# Patient Record
Sex: Female | Born: 2000 | Race: White | Hispanic: No | Marital: Single | Smoking: Never smoker
Health system: Southern US, Community
[De-identification: ages and names within clinical notes are randomized; demographics above are authoritative.]

## PROBLEM LIST (undated history)

## (undated) DIAGNOSIS — R000 Tachycardia, unspecified: Secondary | ICD-10-CM

## (undated) DIAGNOSIS — K219 Gastro-esophageal reflux disease without esophagitis: Secondary | ICD-10-CM

## (undated) DIAGNOSIS — F419 Anxiety disorder, unspecified: Secondary | ICD-10-CM

## (undated) DIAGNOSIS — E282 Polycystic ovarian syndrome: Secondary | ICD-10-CM

## (undated) DIAGNOSIS — F329 Major depressive disorder, single episode, unspecified: Secondary | ICD-10-CM

## (undated) DIAGNOSIS — F4310 Post-traumatic stress disorder, unspecified: Secondary | ICD-10-CM

## (undated) DIAGNOSIS — K760 Fatty (change of) liver, not elsewhere classified: Secondary | ICD-10-CM

## (undated) DIAGNOSIS — F32A Depression, unspecified: Secondary | ICD-10-CM

## (undated) HISTORY — PX: ANKLE SURGERY: SHX546

## (undated) HISTORY — DX: Polycystic ovarian syndrome: E28.2

## (undated) HISTORY — DX: Gastro-esophageal reflux disease without esophagitis: K21.9

## (undated) HISTORY — DX: Fatty (change of) liver, not elsewhere classified: K76.0

## (undated) HISTORY — PX: CYST EXCISION: SHX5701

## (undated) HISTORY — PX: MOUTH SURGERY: SHX715

## (undated) HISTORY — DX: Tachycardia, unspecified: R00.0

---
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
## 2022-12-17 ENCOUNTER — Encounter (HOSPITAL_COMMUNITY): Payer: Self-pay | Admitting: Emergency Medicine

## 2022-12-17 ENCOUNTER — Other Ambulatory Visit: Payer: Self-pay

## 2022-12-17 ENCOUNTER — Emergency Department (HOSPITAL_COMMUNITY)
Admission: EM | Admit: 2022-12-17 | Discharge: 2022-12-17 | Disposition: A | Discharge status: Home / Self Care | Payer: Medicaid Other | Attending: Emergency Medicine | Admitting: Emergency Medicine

## 2022-12-17 DIAGNOSIS — G8918 Other acute postprocedural pain: Secondary | ICD-10-CM | POA: Diagnosis present

## 2022-12-17 MED ORDER — DOXYCYCLINE HYCLATE 100 MG PO CAPS: 100.0000 mg | ORAL_CAPSULE | Freq: Two times a day (BID) | ORAL | 0 refills | Status: DC

## 2022-12-17 NOTE — Discharge Instructions (Signed)
Keep wound clean and dry. Continue to bandage the wound once a day.  You can start antibiotic to help with any possible infection

## 2022-12-17 NOTE — ED Provider Notes (Signed)
[place] EMERGENCY DEPARTMENT AT [facility] Provider Note   CSN: [id number] Arrival date & time: 12/17/22  0032     History  Chief Complaint  Patient presents with   Post-op Problem    Kaitlyn Ferrell is a 22 y.o. female.  The history is provided by the patient and a significant other.  Patient with history of depression presents for postoperative wound check. Patient reports that she has had a complicated history of pilonidal abscesses.  Around June 3 she had cleft lift procedure performed at an outside hospital the surgery clinic.  She has had multiple outpatient visits since that time and is concerned that it is draining fluid.  She is concerned that it is infected.  She has a wound follow-up next month.  No fevers.  She has had normal bowel movements.  She is now having some abdominal cramping but no other acute issues    Past Medical History:  Diagnosis Date   Depression     Home Medications Prior to Admission medications   Medication Sig Start Date End Date Taking? Authorizing Provider  doxycycline (VIBRAMYCIN) 100 MG capsule Take 1 capsule (100 mg total) by mouth 2 (two) times daily. 12/17/22  Yes Zadie Rhine, MD  lidocaine (XYLOCAINE) 2 % solution Gargle 15 mL every 3 hours as needed. May swallow if desired. 03/08/18   Tommie Sams, DO  sertraline (ZOLOFT) 50 MG tablet TAKE 1.5 ORAL TABLET ONCE A DAY X 2 WEEKS THEN 2 TABLETS DAILY 09/08/18   [provider]      Allergies    Bee venom and Latex    Review of Systems   Review of Systems  Constitutional:  Negative for fever.  Skin:  Positive for wound.    Physical Exam Updated Vital Signs BP 121/78 (BP Location: Left Arm)   Pulse 90   Temp 98.5 F (36.9 C) (Oral)   Resp 18   Ht 1.715 m (5' 7.5")   Wt 93.4 kg   SpO2 97%   BMI 31.79 kg/m  Physical Exam CONSTITUTIONAL: Well developed/well nourished HEAD: Normocephalic/atraumatic ABDOMEN: soft, nontender NEURO: Pt is  awake/alert/appropriate, moves all extremitiesx4.  No facial droop.   SKIN: warm, color normal Wound examined with nurse present Overall the wound is very well-healed. The superior portion is well aligned without any erythema, no crepitus, no fluctuance.  The inferior portion is open with minimal erythema.  No discharge.  No foreign bodies are noted. PSYCH: no abnormalities of mood noted, alert and oriented to situation  ED Results / Procedures / Treatments   Labs (all labs ordered are listed, but only abnormal results are displayed) Labs Reviewed - No data to display  EKG None  Radiology No results found.  Procedures Procedures    Medications Ordered in ED Medications - No data to display  ED Course/ Medical Decision Making/ A&P                             Medical Decision Making Risk Prescription drug management.   Overall this wound appears to be healing well.  It appears consistent with descriptions that are noted in the record from previous outpatient visits.  Advised to keep clean, continue wound care and supplies were given to the patient.  Due to some mild erythema, patient would prefer to start an oral antibiotic, she has tolerated doxycycline previously Otherwise patient is safe for outpatient management, no indication for further  workup or imaging at this time        Final Clinical Impression(s) / ED Diagnoses Final diagnoses:  Post-operative pain    Rx / DC Orders ED Discharge Orders          Ordered    doxycycline (VIBRAMYCIN) 100 MG capsule  2 times daily        12/17/22 0148              Zadie Rhine, MD 12/17/22 [phone]

## 2022-12-17 NOTE — ED Triage Notes (Signed)
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 Pt is concerned for infection. States she has brought her concerns up to her surgeon but "feels like they aren't listening".

## 2022-12-23 ENCOUNTER — Emergency Department (HOSPITAL_COMMUNITY)
Admission: EM | Admit: 2022-12-23 | Discharge: 2022-12-23 | Disposition: A | Discharge status: Home / Self Care | Payer: Medicaid Other | Attending: Emergency Medicine | Admitting: Emergency Medicine

## 2022-12-23 ENCOUNTER — Other Ambulatory Visit: Payer: Self-pay

## 2022-12-23 ENCOUNTER — Encounter (HOSPITAL_COMMUNITY): Payer: Self-pay

## 2022-12-23 DIAGNOSIS — Z91040 Latex allergy status: Secondary | ICD-10-CM | POA: Insufficient documentation

## 2022-12-23 DIAGNOSIS — K219 Gastro-esophageal reflux disease without esophagitis: Secondary | ICD-10-CM | POA: Diagnosis not present

## 2022-12-23 DIAGNOSIS — R1013 Epigastric pain: Secondary | ICD-10-CM | POA: Diagnosis present

## 2022-12-23 HISTORY — DX: Anxiety disorder, unspecified: F41.9

## 2022-12-23 HISTORY — DX: Post-traumatic stress disorder, unspecified: F43.10

## 2022-12-23 LAB — COMPREHENSIVE METABOLIC PANEL
ALT: 40 U/L (ref 0–44)
AST: 26 U/L (ref 15–41)
Albumin: 4.2 g/dL (ref 3.5–5.0)
Alkaline Phosphatase: 58 U/L (ref 38–126)
Anion gap: 9 (ref 5–15)
BUN: 8 mg/dL (ref 6–20)
CO2: 24 mmol/L (ref 22–32)
Calcium: 9.6 mg/dL (ref 8.9–10.3)
Chloride: 103 mmol/L (ref 98–111)
Creatinine, Ser: 0.63 mg/dL (ref 0.44–1.00)
GFR, Estimated: >60 mL/min (ref >60)
Glucose, Bld: 124 mg/dL — ABNORMAL HIGH (ref 70–99)
Potassium: 3.5 mmol/L (ref 3.5–5.1)
Sodium: 136 mmol/L (ref 135–145)
Total Bilirubin: 0.8 mg/dL (ref 0.3–1.2)
Total Protein: 7.6 g/dL (ref 6.5–8.1)

## 2022-12-23 LAB — URINALYSIS, ROUTINE W REFLEX MICROSCOPIC
Bilirubin Urine: NEGATIVE
Glucose, UA: NEGATIVE mg/dL
Hgb urine dipstick: NEGATIVE
Ketones, ur: 5 mg/dL — AB
Leukocytes,Ua: NEGATIVE
Nitrite: NEGATIVE
Protein, ur: NEGATIVE mg/dL
Specific Gravity, Urine: 1.02 (ref 1.005–1.030)
pH: 5 (ref 5.0–8.0)

## 2022-12-23 LAB — CBC
HCT: 43.5 % (ref 36.0–46.0)
Hemoglobin: 14.9 g/dL (ref 12.0–15.0)
MCH: 29.4 pg (ref 26.0–34.0)
MCHC: 34.3 g/dL (ref 30.0–36.0)
MCV: 86 fL (ref 80.0–100.0)
Platelets: 372 10*3/uL (ref 150–400)
RBC: 5.06 MIL/uL (ref 3.87–5.11)
RDW: 12.1 % (ref 11.5–15.5)
WBC: 8.4 10*3/uL (ref 4.0–10.5)
nRBC: 0 % (ref 0.0–0.2)

## 2022-12-23 LAB — LIPASE, BLOOD: Lipase: 31 U/L (ref 11–51)

## 2022-12-23 LAB — PREGNANCY, URINE: Preg Test, Ur: NEGATIVE

## 2022-12-23 MED ORDER — FAMOTIDINE IN NACL 20-0.9 MG/50ML-% IV SOLN
20.0000 mg | Freq: Once | INTRAVENOUS | Status: AC
  Administered 2022-12-23: 20 mg via INTRAVENOUS
  Filled 2022-12-23: qty 50

## 2022-12-23 MED ORDER — ONDANSETRON HCL 4 MG PO TABS: 4.0000 mg | ORAL_TABLET | Freq: Four times a day (QID) | ORAL | 0 refills | Status: DC

## 2022-12-23 MED ORDER — PANTOPRAZOLE SODIUM 20 MG PO TBEC: 20.0000 mg | DELAYED_RELEASE_TABLET | Freq: Every day | ORAL | 0 refills | Status: DC

## 2022-12-23 MED ORDER — SODIUM CHLORIDE 0.9 % IV BOLUS
1000.0000 mL | Freq: Once | INTRAVENOUS | Status: AC
  Administered 2022-12-23: 1000 mL via INTRAVENOUS

## 2022-12-23 MED ORDER — ONDANSETRON HCL 4 MG/2ML IJ SOLN
4.0000 mg | Freq: Once | INTRAMUSCULAR | Status: AC | PRN
  Administered 2022-12-23: 4 mg via INTRAVENOUS
  Filled 2022-12-23: qty 2

## 2022-12-23 NOTE — Discharge Instructions (Signed)
Please follow-up with the GI specialist I have attached your for you today regarding recent symptoms and ER visit.  Today your labs and physical exam are reassuring and you most likely have acid reflux causing your symptoms.  Please take the medications I have attached here for you and follow-up with the GI specialist.  In the meantime please avoid spicy foods, hot drinks, carbonated drinks, chocolate, fatty/greasy foods, gluten foods as these may exacerbate your symptoms.  If symptoms change or worsen please return to ER.

## 2022-12-23 NOTE — ED Triage Notes (Signed)
Pt to ER states abdominal pain for last 3-4 days.  She thought it was related to Abx she was taking so she stopped them with not relief.  States pain radiates into her hip.  States nausea and vomiting.

## 2022-12-23 NOTE — ED Notes (Addendum)
Urine sample at bedside

## 2022-12-23 NOTE — ED Provider Notes (Signed)
[place] EMERGENCY DEPARTMENT AT [facility] Provider Note   CSN: [id number] Arrival date & time: 12/23/22  1535     History  Chief Complaint  Patient presents with   Abdominal Pain    Kaitlyn Ferrell is a 22 y.o. female history of acid reflux, pilonidal abscess, PCOS, hepatic steatosis presented with epigastric pain that has been present for 3 days.  Patient states that she was seen last week for pilonidal abscess checked and was given doxycycline.  Patient states that the abscess has healed nicely however since taking the doxycycline patient has been having epigastric tightness that leads to nausea and vomiting.  This is usually triggered after eating bread or greasy foods.  Patient does note that she has history of acid reflux but does not take any prophylactic medication.  Patient notes that when she is puking she begins to feel lightheaded but denies any hematemesis.  Patient denies any fevers, diarrhea/constipation, chest pain, shortness of breath, head pain, vision changes, neck pain, LOC, headache  Home Medications Prior to Admission medications   Medication Sig Start Date End Date Taking? Authorizing Provider  ondansetron (ZOFRAN) 4 MG tablet Take 1 tablet (4 mg total) by mouth every 6 (six) hours. 12/23/22  Yes Misbah Hornaday, Beverly Gust, PA-C  pantoprazole (PROTONIX) 20 MG tablet Take 1 tablet (20 mg total) by mouth daily. 12/23/22 01/22/23 Yes Chisa Kushner, Beverly Gust, PA-C  doxycycline (VIBRAMYCIN) 100 MG capsule Take 1 capsule (100 mg total) by mouth 2 (two) times daily. 12/17/22   Zadie Rhine, MD  lidocaine (XYLOCAINE) 2 % solution Gargle 15 mL every 3 hours as needed. May swallow if desired. 03/08/18   Tommie Sams, DO  sertraline (ZOLOFT) 50 MG tablet TAKE 1.5 ORAL TABLET ONCE A DAY X 2 WEEKS THEN 2 TABLETS DAILY 09/08/18   [provider]      Allergies    Amoxicillin, Bee venom, and Latex    Review of Systems   Review of Systems  Gastrointestinal:  Positive for  abdominal pain.    Physical Exam Updated Vital Signs BP 118/70 (BP Location: Right Arm)   Pulse 83   Temp 98.9 F (37.2 C) (Oral)   Resp 18   Ht 5\' 7"  (1.702 m)   Wt 93.4 kg   LMP 03/25/2022   SpO2 96%   BMI 32.26 kg/m  Physical Exam Vitals reviewed.  Constitutional:      General: She is not in acute distress. HENT:     Head: Normocephalic and atraumatic.  Eyes:     Extraocular Movements: Extraocular movements intact.     Conjunctiva/sclera: Conjunctivae normal.     Pupils: Pupils are equal, round, and reactive to light.  Cardiovascular:     Rate and Rhythm: Normal rate and regular rhythm.     Pulses: Normal pulses.     Heart sounds: Normal heart sounds.     Comments: 2+ bilateral radial/dorsalis pedis pulses with regular rate Pulmonary:     Effort: Pulmonary effort is normal. No respiratory distress.     Breath sounds: Normal breath sounds.  Abdominal:     Palpations: Abdomen is soft.     Tenderness: There is no abdominal tenderness. There is no guarding or rebound.  Musculoskeletal:        General: Normal range of motion.     Cervical back: Normal range of motion and neck supple.     Comments: 5 out of 5 bilateral grip/leg extension strength  Skin:    General:  Skin is warm and dry.     Capillary Refill: Capillary refill takes less than 2 seconds.  Neurological:     General: No focal deficit present.     Mental Status: She is alert and oriented to person, place, and time.     Comments: Sensation intact in all 4 limbs  Psychiatric:        Mood and Affect: Mood normal.     ED Results / Procedures / Treatments   Labs (all labs ordered are listed, but only abnormal results are displayed) Labs Reviewed  COMPREHENSIVE METABOLIC PANEL - Abnormal; Notable for the following components:      Result Value   Glucose, Bld 124 (*)    All other components within normal limits  URINALYSIS, ROUTINE W REFLEX MICROSCOPIC - Abnormal; Notable for the following components:    Ketones, ur 5 (*)    All other components within normal limits  LIPASE, BLOOD  CBC  PREGNANCY, URINE  POC URINE PREG, ED    EKG None  Radiology No results found.  Procedures Procedures    Medications Ordered in ED Medications  famotidine (PEPCID) IVPB 20 mg premix (20 mg Intravenous New Bag/Given 12/23/22 1726)  ondansetron (ZOFRAN) injection 4 mg (4 mg Intravenous Given 12/23/22 1632)  sodium chloride 0.9 % bolus 1,000 mL (1,000 mLs Intravenous New Bag/Given 12/23/22 1722)    ED Course/ Medical Decision Making/ A&P                             Medical Decision Making Amount and/or Complexity of Data Reviewed Labs: ordered.  Risk Prescription drug management.   Kaitlyn Ferrell 21 y.o. presented today for epigastric abdominal pain. Working DDx that I considered at this time includes, but not limited to, GERD, gluten intolerance, celiac, gastroenteritis, colitis, small bowel obstruction, appendicitis, cholecystitis, pancreatitis, nephrolithiasis, AAA, UTI, pyelonephritis, ruptured ectopic pregnancy, PID, ovarian torsion.  R/o DDx: gluten intolerance, celiac, gastroenteritis, colitis, small bowel obstruction, appendicitis, cholecystitis, pancreatitis, nephrolithiasis, AAA, UTI, pyelonephritis, ruptured ectopic pregnancy, PID, ovarian torsion: These are considered less likely due to history of present illness and physical exam findings.  Review of prior external notes: 12/18/2022 ED  Unique Tests and My Interpretation:  CBC with differential: Unremarkable CMP: Unremarkable Lipase: Unremarkable UA: Unremarkable Urine Pregnancy: Negative EKG: Rate, rhythm, axis, intervals all examined and without medically relevant abnormality. ST segments without concerns for elevations  Discussion with Independent Historian:  Significant other  Discussion of Management of Tests: None  Risk: Medium: prescription drug management  Risk Stratification Score: None  Plan: On exam  patient was in no acute distress and had stable vitals.  Patient was tender in the epigastric region but no peritoneal signs were noted.  The rest patient's physical exam was reassuring.  Due to patient's history of high suspect patient may have gluten intolerance as her symptoms seem to be exacerbated after eating bread.  Patient may also be having exacerbation of her acid reflux since she states after eating greasy foods she has stomach cramping with nausea vomiting.  Abdominal labs were ordered and patient was given Zofran for her nausea which appeared to help.  Patient was given fluids and Pepcid and anticipated discharge with PPI and GI follow-up.  Patient stable at this time.  On recheck patient stated that she felt much better after receiving the fluids and Pepcid.  Patient had reassuring labs.  At this time suspect patient's symptoms are acid reflux related  with possible gluten intolerance as patient states eating bread exacerbates his symptoms as well.  I recommended patient avoid foods that may trigger acid reflux or gluten intolerance and to follow-up with a GI specialist for further management.  Patient will be prescribed Zofran and Protonix to take in the meantime.  At this time with normal labs and reassuring physical exam a CT scan is not needed.  Patient was given return precautions. Patient stable for discharge at this time.  Patient verbalized understanding of plan.         Final Clinical Impression(s) / ED Diagnoses Final diagnoses:  Gastroesophageal reflux disease without esophagitis    Rx / DC Orders ED Discharge Orders          Ordered    ondansetron (ZOFRAN) 4 MG tablet  Every 6 hours        12/23/22 1736    pantoprazole (PROTONIX) 20 MG tablet  Daily        12/23/22 1736              Remi Deter 12/23/22 1739    Terrilee Files, MD 12/24/22 1110

## 2022-12-24 ENCOUNTER — Encounter (HOSPITAL_COMMUNITY): Payer: Self-pay | Admitting: Emergency Medicine

## 2022-12-24 ENCOUNTER — Other Ambulatory Visit: Payer: Self-pay

## 2022-12-24 ENCOUNTER — Emergency Department (HOSPITAL_COMMUNITY)
Admission: EM | Admit: 2022-12-24 | Discharge: 2022-12-25 | Discharge status: Against Medical Advice | Payer: Medicaid Other | Attending: Emergency Medicine | Admitting: Emergency Medicine

## 2022-12-24 DIAGNOSIS — R1010 Upper abdominal pain, unspecified: Secondary | ICD-10-CM | POA: Insufficient documentation

## 2022-12-24 DIAGNOSIS — R1110 Vomiting, unspecified: Secondary | ICD-10-CM | POA: Insufficient documentation

## 2022-12-24 DIAGNOSIS — Z5321 Procedure and treatment not carried out due to patient leaving prior to being seen by health care provider: Secondary | ICD-10-CM | POA: Insufficient documentation

## 2022-12-24 LAB — COMPREHENSIVE METABOLIC PANEL
ALT: 36 U/L (ref 0–44)
AST: 23 U/L (ref 15–41)
Albumin: 4.1 g/dL (ref 3.5–5.0)
Alkaline Phosphatase: 60 U/L (ref 38–126)
Anion gap: 7 (ref 5–15)
BUN: 10 mg/dL (ref 6–20)
CO2: 26 mmol/L (ref 22–32)
Calcium: 9.5 mg/dL (ref 8.9–10.3)
Chloride: 104 mmol/L (ref 98–111)
Creatinine, Ser: 0.84 mg/dL (ref 0.44–1.00)
GFR, Estimated: >60 mL/min (ref >60)
Glucose, Bld: 106 mg/dL — ABNORMAL HIGH (ref 70–99)
Potassium: 3.8 mmol/L (ref 3.5–5.1)
Sodium: 137 mmol/L (ref 135–145)
Total Bilirubin: 0.5 mg/dL (ref 0.3–1.2)
Total Protein: 7.5 g/dL (ref 6.5–8.1)

## 2022-12-24 LAB — CBC
HCT: 42.9 % (ref 36.0–46.0)
Hemoglobin: 14.7 g/dL (ref 12.0–15.0)
MCH: 29.6 pg (ref 26.0–34.0)
MCHC: 34.3 g/dL (ref 30.0–36.0)
MCV: 86.3 fL (ref 80.0–100.0)
Platelets: 372 10*3/uL (ref 150–400)
RBC: 4.97 MIL/uL (ref 3.87–5.11)
RDW: 12.1 % (ref 11.5–15.5)
WBC: 9.7 10*3/uL (ref 4.0–10.5)
nRBC: 0 % (ref 0.0–0.2)

## 2022-12-24 LAB — LIPASE, BLOOD: Lipase: 36 U/L (ref 11–51)

## 2022-12-24 MED ORDER — ONDANSETRON HCL 4 MG/2ML IJ SOLN: 4.0000 mg | Freq: Once | INTRAMUSCULAR | Status: DC | PRN

## 2022-12-24 NOTE — ED Triage Notes (Signed)
Pt via POV c/o continued emesis and upper abdominal pain x 4-5 days, seen yesterday for same. She was told yesterday that she may have a gluten intolerance or gluten allergy and that she should return if symptoms persist or worsen. Estimates 5 episodes of vomiting today despite eating gluten free foods all day.

## 2022-12-25 NOTE — ED Notes (Signed)
Pt called the [facility] number to discuss her wait time and "attitude of staff in the ed"  pt started the phone call with cursing, [facility] asked the pt for her name and explained that I was sorry for her wait but that cursing at staff was not tolerated and pt hung up the phone.

## 2022-12-26 ENCOUNTER — Ambulatory Visit (INDEPENDENT_AMBULATORY_CARE_PROVIDER_SITE_OTHER): Payer: Medicaid Other | Admitting: Gastroenterology

## 2022-12-26 ENCOUNTER — Encounter: Payer: Self-pay | Admitting: Gastroenterology

## 2022-12-26 VITALS — BP 109/70 | HR 97 | Temp 98.8°F | Ht 67.0 in | Wt 206.0 lb

## 2022-12-26 DIAGNOSIS — R112 Nausea with vomiting, unspecified: Secondary | ICD-10-CM

## 2022-12-26 DIAGNOSIS — R109 Unspecified abdominal pain: Secondary | ICD-10-CM

## 2022-12-26 DIAGNOSIS — R140 Abdominal distension (gaseous): Secondary | ICD-10-CM | POA: Insufficient documentation
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
## 2022-12-26 NOTE — Patient Instructions (Signed)
Please complete blood work. We will be in touch by MyChart!  I have attached a handout for gluten-free diet. Avoid gluten completely so you can avoid symptoms.  Continue pantoprazole once a day for now, 30 minutes before breakfast!  Further recommendations to follow!   It was a pleasure to see you today. I want to create trusting relationships with patients and provide genuine, compassionate, and quality care. I truly value your feedback, so please be on the lookout for a survey regarding your visit with me today. I appreciate your time in completing this!         [name] Mink, PhD, ANP-BC [facility] Gastroenterology
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
## 2022-12-27 LAB — IGA: IgA/Immunoglobulin A, Serum: 226 mg/dL (ref 87–352)

## 2022-12-29 LAB — TISSUE TRANSGLUTAMINASE, IGA: Transglutaminase IgA: <2 U/mL (ref 0–3)

## 2023-01-01 ENCOUNTER — Telehealth: Payer: Self-pay

## 2023-01-01 DIAGNOSIS — R109 Unspecified abdominal pain: Secondary | ICD-10-CM

## 2023-01-01 DIAGNOSIS — R112 Nausea with vomiting, unspecified: Secondary | ICD-10-CM

## 2023-01-01 NOTE — Telephone Encounter (Signed)
noted 

## 2023-01-01 NOTE — Telephone Encounter (Signed)
Sent in mychart

## 2023-01-01 NOTE — Telephone Encounter (Signed)
I was sent a vm from up the street regarding this pt. She was seen at [facility] on July 3rd. Pt has seen her blood work but she is wanting you to follow up with blood work with her. Please advise

## 2023-01-02 ENCOUNTER — Other Ambulatory Visit: Payer: Self-pay | Admitting: *Deleted

## 2023-01-02 DIAGNOSIS — R109 Unspecified abdominal pain: Secondary | ICD-10-CM

## 2023-01-02 DIAGNOSIS — R140 Abdominal distension (gaseous): Secondary | ICD-10-CM

## 2023-01-02 NOTE — Telephone Encounter (Signed)
Please arrange RUQ [name] ASAP due to abdominal pain,bloating, concern for cholelithiasis/cholecystitis.   Patient communicates on MyChart.

## 2023-01-03 ENCOUNTER — Ambulatory Visit (HOSPITAL_COMMUNITY)
Admission: RE | Admit: 2023-01-03 | Discharge: 2023-01-03 | Disposition: A | Discharge status: Home / Self Care | Payer: Medicaid Other | Source: Ambulatory Visit | Attending: Gastroenterology | Admitting: Gastroenterology

## 2023-01-03 DIAGNOSIS — R109 Unspecified abdominal pain: Secondary | ICD-10-CM | POA: Diagnosis present

## 2023-01-03 DIAGNOSIS — R140 Abdominal distension (gaseous): Secondary | ICD-10-CM | POA: Insufficient documentation

## 2023-01-04 MED ORDER — PANTOPRAZOLE SODIUM 40 MG PO TBEC: 40.0000 mg | DELAYED_RELEASE_TABLET | Freq: Every day | ORAL | 3 refills | Status: DC

## 2023-01-04 NOTE — Telephone Encounter (Signed)
Spoke with pt. She has been scheduled Tuesday 7/16 with Dr. Marletta Lor. She is aware needs to go 1st thing Monday AM to have urine preg test at [facility]. Discussed instructions and also sent to [facility].

## 2023-01-04 NOTE — Telephone Encounter (Signed)
Please arrange EGD with Dr. Marletta Lor. Diagnosis: N/V, epigastric pain. Needs pregnancy screen prior.   ASA 2.  I have increased pantoprazole to 40 mg instead of 20 mg and let patient know in MyChart.

## 2023-01-04 NOTE — Telephone Encounter (Signed)
Called pt back, LMOVM to call back

## 2023-01-04 NOTE — Addendum Note (Signed)
Addended by: [name] Mink on: 01/04/2023 10:47 AM   Modules accepted: Orders

## 2023-01-04 NOTE — Addendum Note (Signed)
Addended by: Armstead Peaks on: 01/04/2023 11:33 AM   Modules accepted: Orders

## 2023-01-08 ENCOUNTER — Other Ambulatory Visit: Payer: Self-pay

## 2023-01-08 ENCOUNTER — Encounter (HOSPITAL_COMMUNITY)
Admission: RE | Disposition: A | Discharge status: Home / Self Care | Payer: Self-pay | Source: Home / Self Care | Attending: Internal Medicine

## 2023-01-08 ENCOUNTER — Ambulatory Visit (HOSPITAL_COMMUNITY)
Admission: RE | Admit: 2023-01-08 | Discharge: 2023-01-08 | Disposition: A | Discharge status: Home / Self Care | Payer: Medicaid Other | Attending: Internal Medicine | Admitting: Internal Medicine

## 2023-01-08 ENCOUNTER — Ambulatory Visit (HOSPITAL_BASED_OUTPATIENT_CLINIC_OR_DEPARTMENT_OTHER): Payer: Medicaid Other | Admitting: Anesthesiology

## 2023-01-08 ENCOUNTER — Encounter (HOSPITAL_COMMUNITY): Payer: Self-pay

## 2023-01-08 ENCOUNTER — Ambulatory Visit (HOSPITAL_COMMUNITY): Payer: Self-pay | Admitting: Anesthesiology

## 2023-01-08 DIAGNOSIS — R109 Unspecified abdominal pain: Secondary | ICD-10-CM

## 2023-01-08 DIAGNOSIS — K219 Gastro-esophageal reflux disease without esophagitis: Secondary | ICD-10-CM | POA: Insufficient documentation

## 2023-01-08 DIAGNOSIS — K2970 Gastritis, unspecified, without bleeding: Secondary | ICD-10-CM

## 2023-01-08 DIAGNOSIS — R112 Nausea with vomiting, unspecified: Secondary | ICD-10-CM

## 2023-01-08 DIAGNOSIS — K760 Fatty (change of) liver, not elsewhere classified: Secondary | ICD-10-CM | POA: Diagnosis not present

## 2023-01-08 HISTORY — PX: ESOPHAGOGASTRODUODENOSCOPY (EGD) WITH PROPOFOL: SHX5813

## 2023-01-08 HISTORY — PX: BIOPSY: SHX5522

## 2023-01-08 LAB — POCT PREGNANCY, URINE: Preg Test, Ur: NEGATIVE

## 2023-01-08 SURGERY — ESOPHAGOGASTRODUODENOSCOPY (EGD) WITH PROPOFOL
Anesthesia: General

## 2023-01-08 MED ORDER — LIDOCAINE HCL (CARDIAC) PF 100 MG/5ML IV SOSY
PREFILLED_SYRINGE | INTRAVENOUS | Status: DC | PRN
  Administered 2023-01-08: 80 mg via INTRAVENOUS

## 2023-01-08 MED ORDER — PROPOFOL 10 MG/ML IV BOLUS
INTRAVENOUS | Status: DC | PRN
Start: 2023-01-08 — End: 2023-01-08
  Administered 2023-01-08 (×2): 50 mg via INTRAVENOUS
  Administered 2023-01-08: 200 mg via INTRAVENOUS

## 2023-01-08 MED ORDER — LACTATED RINGERS IV SOLN: INTRAVENOUS | Status: DC

## 2023-01-08 NOTE — Interval H&P Note (Signed)
History and Physical Interval Note:  01/08/2023 8:07 AM  [employer]  has presented today for surgery, with the diagnosis of NAUSEA, VOMITING, epigastric pain.  The various methods of treatment have been discussed with the patient and family. After consideration of risks, benefits and other options for treatment, the patient has consented to  Procedure(s) with comments: ESOPHAGOGASTRODUODENOSCOPY (EGD) WITH PROPOFOL (N/A) - 800am, asa 2 as a surgical intervention.  The patient's history has been reviewed, patient examined, no change in status, stable for surgery.  I have reviewed the patient's chart and labs.  Questions were answered to the patient's satisfaction.     Lanelle Bal

## 2023-01-08 NOTE — Anesthesia Postprocedure Evaluation (Signed)
Anesthesia Post Note  Patient: Kaitlyn Ferrell  Procedure(s) Performed: ESOPHAGOGASTRODUODENOSCOPY (EGD) WITH PROPOFOL BIOPSY  Patient location during evaluation: PACU Anesthesia Type: General Level of consciousness: awake and alert and oriented Pain management: pain level controlled Vital Signs Assessment: post-procedure vital signs reviewed and stable Respiratory status: spontaneous breathing, nonlabored ventilation and respiratory function stable Cardiovascular status: blood pressure returned to baseline and stable Postop Assessment: no apparent nausea or vomiting Anesthetic complications: no  No notable events documented.   Last Vitals:  Vitals:   01/08/23 0714 01/08/23 0828  BP: 117/72 (!) 106/42  Pulse: 71 82  Resp: 18 (!) 22  Temp: 36.8 C 36.6 C  SpO2: 98% 96%    Last Pain:  Vitals:   01/08/23 0840  TempSrc:   PainSc: 0-No pain                 Alvin Rubano C Giuseppina Quinones

## 2023-01-08 NOTE — Discharge Instructions (Addendum)
EGD Discharge instructions Please read the instructions outlined below and refer to this sheet in the next few weeks. These discharge instructions provide you with general information on caring for yourself after you leave the hospital. Your doctor may also give you specific instructions. While your treatment has been planned according to the most current medical practices available, unavoidable complications occasionally occur. If you have any problems or questions after discharge, please call your doctor. ACTIVITY You may resume your regular activity but move at a slower pace for the next 24 hours.  Take frequent rest periods for the next 24 hours.  Walking will help expel (get rid of) the air and reduce the bloated feeling in your abdomen.  No driving for 24 hours (because of the anesthesia (medicine) used during the test).  You may shower.  Do not sign any important legal documents or operate any machinery for 24 hours (because of the anesthesia used during the test).  NUTRITION Drink plenty of fluids.  You may resume your normal diet.  Begin with a light meal and progress to your normal diet.  Avoid alcoholic beverages for 24 hours or as instructed by your caregiver.  MEDICATIONS You may resume your normal medications unless your caregiver tells you otherwise.  WHAT YOU CAN EXPECT TODAY You may experience abdominal discomfort such as a feeling of fullness or "gas" pains.  FOLLOW-UP Your doctor will discuss the results of your test with you.  SEEK IMMEDIATE MEDICAL ATTENTION IF ANY OF THE FOLLOWING OCCUR: Excessive nausea (feeling sick to your stomach) and/or vomiting.  Severe abdominal pain and distention (swelling).  Trouble swallowing.  Temperature over 101 F (37.8 C).  Rectal bleeding or vomiting of blood.   Your EGD revealed mild amount inflammation in your stomach.  I took biopsies of this to rule out infection with a bacteria called H. pylori.    Esophagus appeared normal.   Small bowel appeared normal.  I did take biopsies of both however given your symptoms.  Await pathology results, my office will contact you.  Continue on pantoprazole daily.  Follow-up in GI office in 2 to 3 months. Office will contact you about appointment.   I hope you have a great rest of your week!  [name]. Marletta Lor, D.O. Gastroenterology and Hepatology [facility]

## 2023-01-08 NOTE — Transfer of Care (Signed)
Immediate Anesthesia Transfer of Care Note  Patient: Kaitlyn Ferrell  Procedure(s) Performed: ESOPHAGOGASTRODUODENOSCOPY (EGD) WITH PROPOFOL BIOPSY  Patient Location: Endoscopy Unit  Anesthesia Type:General  Level of Consciousness: drowsy and patient cooperative  Airway & Oxygen Therapy: Patient Spontanous Breathing  Post-op Assessment: Report given to RN and Post -op Vital signs reviewed and stable  Post vital signs: Reviewed and stable  Last Vitals:  Vitals Value Taken Time  BP 106/42 01/08/23 0828  Temp 36.6 C 01/08/23 0828  Pulse 82 01/08/23 0828  Resp 22 01/08/23 0828  SpO2 96 % 01/08/23 0828    Last Pain:  Vitals:   01/08/23 0828  TempSrc: Oral  PainSc:       Patients Stated Pain Goal: 4 (01/08/23 0714)  Complications: No notable events documented.

## 2023-01-08 NOTE — Anesthesia Procedure Notes (Signed)
Date/Time: 01/08/2023 8:12 AM  Performed by: Franco Nones, CRNAPre-anesthesia Checklist: Patient identified, Emergency Drugs available, Suction available, Timeout performed and Patient being monitored Patient Re-evaluated:Patient Re-evaluated prior to induction Oxygen Delivery Method: Nasal Cannula

## 2023-01-08 NOTE — Anesthesia Preprocedure Evaluation (Addendum)
Anesthesia Evaluation  Patient identified by MRN, date of birth, ID band Patient awake    Reviewed: Allergy & Precautions, H&P , NPO status , Patient's Chart, lab work & pertinent test results  Airway Mallampati: II  TM Distance: >3 FB Neck ROM: Full    Dental  (+) Dental Advisory Given, Teeth Intact   Pulmonary neg pulmonary ROS, Patient abstained from smoking.   Pulmonary exam normal breath sounds clear to auscultation       Cardiovascular negative cardio ROS Normal cardiovascular exam Rhythm:Regular Rate:Normal     Neuro/Psych  PSYCHIATRIC DISORDERS Anxiety Depression    negative neurological ROS     GI/Hepatic Neg liver ROS,GERD  Medicated,,  Endo/Other  negative endocrine ROS    Renal/GU negative Renal ROS  negative genitourinary   Musculoskeletal negative musculoskeletal ROS (+)    Abdominal   Peds negative pediatric ROS (+)  Hematology negative hematology ROS (+)   Anesthesia Other Findings   Reproductive/Obstetrics negative OB ROS                             Anesthesia Physical Anesthesia Plan  ASA: 2  Anesthesia Plan: General   Post-op Pain Management: Minimal or no pain anticipated   Induction: Intravenous  PONV Risk Score and Plan: 1 and Propofol infusion  Airway Management Planned: Nasal Cannula and Natural Airway  Additional Equipment:   Intra-op Plan:   Post-operative Plan:   Informed Consent: I have reviewed the patients History and Physical, chart, labs and discussed the procedure including the risks, benefits and alternatives for the proposed anesthesia with the patient or authorized representative who has indicated his/her understanding and acceptance.     Dental advisory given  Plan Discussed with: CRNA and Surgeon  Anesthesia Plan Comments:        Anesthesia Quick Evaluation

## 2023-01-08 NOTE — Op Note (Signed)
[facility] Patient Name: Kaitlyn Ferrell Procedure Date: 01/08/2023 8:05 AM MRN: [id number] Date of Birth: 2000-08-10 Attending MD: [name]. Marletta Lor , [place], [id number] CSN: [id number] Age: 22 Admit Type: Outpatient Procedure:                Upper GI endoscopy Indications:              Epigastric abdominal pain, Heartburn, Nausea Providers:                [name]. Marletta Lor, DO, Sheran Fava, Pandora Leiter, Technician Referring MD:              Medicines:                See the Anesthesia note for documentation of the                            administered medications Complications:            No immediate complications. Estimated Blood Loss:     Estimated blood loss was minimal. Procedure:                Pre-Anesthesia Assessment:                           - The anesthesia plan was to use monitored                            anesthesia care (MAC).                           After obtaining informed consent, the endoscope was                            passed under direct vision. Throughout the                            procedure, the patient's blood pressure, pulse, and                            oxygen saturations were monitored continuously. The                            [id number]-H190 ([id number]) scope was introduced through the                            mouth, and advanced to the second part of duodenum.                            The upper GI endoscopy was accomplished without                            difficulty. The patient tolerated the procedure                            well. Scope In:  8:19:08 AM Scope Out: 8:24:44 AM Total Procedure Duration: 0 hours 5 minutes 36 seconds  Findings:      The Z-line was regular and was found 39 cm from the incisors.      Biopsies were taken with a cold forceps in the middle third of the       esophagus for histology.      Localized mild inflammation characterized by erythema was found in the       gastric  antrum. Biopsies were taken with a cold forceps for Helicobacter       pylori testing.      The duodenal bulb, first portion of the duodenum and second portion of       the duodenum were normal. Biopsies for histology were taken with a cold       forceps for evaluation of celiac disease. Impression:               - Z-line regular, 39 cm from the incisors.                           - Gastritis. Biopsied.                           - Normal duodenal bulb, first portion of the                            duodenum and second portion of the duodenum.                            Biopsied.                           - Biopsies were taken with a cold forceps for                            histology in the middle third of the esophagus. Moderate Sedation:      Per Anesthesia Care Recommendation:           - Patient has a contact number available for                            emergencies. The signs and symptoms of potential                            delayed complications were discussed with the                            patient. Return to normal activities tomorrow.                            Written discharge instructions were provided to the                            patient.                           - Resume previous diet.                           -  Continue present medications.                           - Await pathology results.                           - Use Protonix (pantoprazole) 40 mg PO daily.                           - Return to GI clinic in 3 months. Procedure Code(s):        --- Professional ---                           [id number], Esophagogastroduodenoscopy, flexible,                            transoral; with biopsy, single or multiple Diagnosis Code(s):        --- Professional ---                           K29.70, Gastritis, unspecified, without bleeding                           R10.13, Epigastric pain                           R12, Heartburn                           R11.0,  Nausea CPT copyright 2022 American Medical Association. All rights reserved. The codes documented in this report are preliminary and upon coder review may  be revised to meet current compliance requirements. [name]. Marletta Lor, DO [name]. Marletta Lor, DO 01/08/2023 8:27:55 AM This report has been signed electronically. Number of Addenda: 0

## 2023-01-09 ENCOUNTER — Telehealth: Payer: Self-pay | Admitting: *Deleted

## 2023-01-09 ENCOUNTER — Other Ambulatory Visit: Payer: Self-pay | Admitting: Internal Medicine

## 2023-01-09 ENCOUNTER — Ambulatory Visit (HOSPITAL_COMMUNITY)
Admission: RE | Admit: 2023-01-09 | Discharge: 2023-01-09 | Disposition: A | Discharge status: Home / Self Care | Payer: Medicaid Other | Source: Ambulatory Visit | Attending: Internal Medicine | Admitting: Internal Medicine

## 2023-01-09 DIAGNOSIS — R0789 Other chest pain: Secondary | ICD-10-CM

## 2023-01-09 DIAGNOSIS — R1013 Epigastric pain: Secondary | ICD-10-CM | POA: Insufficient documentation

## 2023-01-09 LAB — SURGICAL PATHOLOGY

## 2023-01-09 NOTE — Telephone Encounter (Signed)
Called and spoke with patient.  She states she has been having epigastric discomfort and chest pain which started last evening.  Underwent upper endoscopy yesterday.  States she has vomited multiple times.  Taking Gas-X without relief.  Will order stat chest and abdominal x-rays to be performed today.  Continue Gas-X as needed.

## 2023-01-09 NOTE — Telephone Encounter (Signed)
Pt had EGD yesterday. Called today states she is having discomfort and feeling uncomfortable in her belly and chest. Having a lot of pressure in abdomen, chest and between shoulders. She thinks from air going in her yesterday. She is taking gax x 2 every 4 hours. Vomited last night after eating and today has nausea. She has zofran for nausea that is helping. States she is just super uncomfortable and her belly is swollen.   [phone].

## 2023-01-10 ENCOUNTER — Other Ambulatory Visit: Payer: Self-pay | Admitting: Internal Medicine

## 2023-01-10 MED ORDER — PANTOPRAZOLE SODIUM 40 MG PO TBEC: 40.0000 mg | DELAYED_RELEASE_TABLET | Freq: Two times a day (BID) | ORAL | 3 refills | Status: DC

## 2023-01-14 ENCOUNTER — Encounter (HOSPITAL_COMMUNITY): Payer: Self-pay | Admitting: Internal Medicine

## 2023-01-31 MED ORDER — SUCRALFATE 1 GM/10ML PO SUSP: 1.0000 g | Freq: Three times a day (TID) | ORAL | 1 refills | Status: DC

## 2023-01-31 NOTE — Addendum Note (Signed)
Addended by: [name] Mink on: 01/31/2023 05:15 PM   Modules accepted: Orders

## 2023-02-01 NOTE — Telephone Encounter (Signed)
The pt needs a follow up appt scheduled to follow up after her procedure.

## 2023-02-28 ENCOUNTER — Encounter: Payer: Self-pay | Admitting: Internal Medicine

## 2023-02-28 ENCOUNTER — Encounter: Payer: Self-pay | Admitting: *Deleted

## 2023-02-28 ENCOUNTER — Telehealth: Payer: Self-pay

## 2023-02-28 ENCOUNTER — Telehealth: Payer: Medicaid Other | Admitting: Internal Medicine

## 2023-02-28 VITALS — Ht 67.0 in | Wt 204.0 lb

## 2023-02-28 DIAGNOSIS — R112 Nausea with vomiting, unspecified: Secondary | ICD-10-CM | POA: Diagnosis not present

## 2023-02-28 DIAGNOSIS — R1013 Epigastric pain: Secondary | ICD-10-CM

## 2023-02-28 DIAGNOSIS — R140 Abdominal distension (gaseous): Secondary | ICD-10-CM | POA: Diagnosis not present

## 2023-02-28 MED ORDER — SUCRALFATE 1 G PO TABS: 1.0000 g | ORAL_TABLET | Freq: Three times a day (TID) | ORAL | 11 refills | Status: DC

## 2023-02-28 NOTE — Telephone Encounter (Signed)
.  [facility] 
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
## 2023-03-07 ENCOUNTER — Encounter (HOSPITAL_COMMUNITY): Payer: Self-pay

## 2023-03-07 ENCOUNTER — Encounter (HOSPITAL_COMMUNITY)
Admission: RE | Admit: 2023-03-07 | Discharge: 2023-03-07 | Disposition: A | Discharge status: Home / Self Care | Payer: Medicaid Other | Source: Ambulatory Visit | Attending: Internal Medicine | Admitting: Internal Medicine

## 2023-03-07 DIAGNOSIS — R1013 Epigastric pain: Secondary | ICD-10-CM | POA: Insufficient documentation

## 2023-03-07 DIAGNOSIS — R112 Nausea with vomiting, unspecified: Secondary | ICD-10-CM | POA: Insufficient documentation

## 2023-03-07 DIAGNOSIS — R140 Abdominal distension (gaseous): Secondary | ICD-10-CM | POA: Insufficient documentation

## 2023-03-07 MED ORDER — SINCALIDE 5 MCG IJ SOLR
0.0200 ug/kg | Freq: Once | INTRAMUSCULAR | Status: AC
  Administered 2023-03-07: 1.86 ug via INTRAVENOUS

## 2023-03-07 MED ORDER — TECHNETIUM TC 99M MEBROFENIN IV KIT
5.0000 | PACK | Freq: Once | INTRAVENOUS | Status: AC | PRN
  Administered 2023-03-07: 4.8 via INTRAVENOUS

## 2023-03-07 MED ORDER — STERILE WATER FOR INJECTION IJ SOLN
INTRAMUSCULAR | Status: AC
  Filled 2023-03-07: qty 10

## 2023-03-07 MED ORDER — SINCALIDE 5 MCG IJ SOLR
INTRAMUSCULAR | Status: AC
  Filled 2023-03-07: qty 5

## 2023-03-07 MED ORDER — STERILE WATER FOR INJECTION IJ SOLN
1.8600 mL | Freq: Once | INTRAMUSCULAR | Status: AC
  Administered 2023-03-07: 1.86 mL via INTRAMUSCULAR

## 2023-03-13 ENCOUNTER — Encounter: Payer: Self-pay | Admitting: Internal Medicine
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
## 2023-03-19 ENCOUNTER — Telehealth: Payer: Self-pay

## 2023-03-19 NOTE — Telephone Encounter (Signed)
Pt phoned wanting the results of her procedure she had done. She also sent you a MyChart message. Please advise

## 2023-04-11 ENCOUNTER — Encounter: Payer: Self-pay | Admitting: Gastroenterology

## 2023-04-11 ENCOUNTER — Telehealth: Payer: Medicaid Other | Admitting: Gastroenterology

## 2023-04-11 ENCOUNTER — Telehealth: Payer: Self-pay

## 2023-04-11 VITALS — Ht 67.0 in | Wt 203.0 lb

## 2023-04-11 DIAGNOSIS — R109 Unspecified abdominal pain: Secondary | ICD-10-CM | POA: Diagnosis not present

## 2023-04-11 DIAGNOSIS — K828 Other specified diseases of gallbladder: Secondary | ICD-10-CM

## 2023-04-11 NOTE — Addendum Note (Signed)
Addended by: Armstead Peaks on: 04/11/2023 04:26 PM   Modules accepted: Orders

## 2023-04-11 NOTE — Patient Instructions (Signed)
We are referring you to General Surgery.  We will see you in 2 months!  I enjoyed seeing you again today! I value our relationship and want to provide genuine, compassionate, and quality care. You may receive a survey regarding your visit with me, and I welcome your feedback! Thanks so much for taking the time to complete this. I look forward to seeing you again.      [name] Mink, PhD, ANP-BC [facility]

## 2023-04-11 NOTE — Telephone Encounter (Signed)
[employer], you are scheduled for a virtual visit with your provider today.  Just as we do with appointments in the office, we must obtain your consent to participate.  Your consent will be active for this visit and any virtual visit you may have with one of our providers in the next 365 days.  If you have a MyChart account, I can also send a copy of this consent to you electronically.  All virtual visits are billed to your insurance company just like a traditional visit in the office.  As this is a virtual visit, video technology does not allow for your provider to perform a traditional examination.  This may limit your provider's ability to fully assess your condition.  If your provider identifies any concerns that need to be evaluated in person or the need to arrange testing such as labs, EKG, etc, we will make arrangements to do so.  Although advances in technology are sophisticated, we cannot ensure that it will always work on either your end or our end.  If the connection with a video visit is poor, we may have to switch to a telephone visit.  With either a video or telephone visit, we are not always able to ensure that we have a secure connection.   I need to obtain your verbal consent now.   Are you willing to proceed with your visit today? YES
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
## 2023-04-25 ENCOUNTER — Ambulatory Visit: Payer: Medicaid Other | Admitting: Surgery

## 2023-04-25 ENCOUNTER — Encounter: Payer: Self-pay | Admitting: Surgery

## 2023-04-25 VITALS — BP 103/69 | HR 55 | Temp 98.1°F | Resp 12 | Ht 67.0 in | Wt 201.0 lb

## 2023-04-25 DIAGNOSIS — K8050 Calculus of bile duct without cholangitis or cholecystitis without obstruction: Secondary | ICD-10-CM
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
## 2023-05-07 ENCOUNTER — Encounter: Payer: Self-pay | Admitting: Gastroenterology

## 2023-05-13 ENCOUNTER — Encounter (HOSPITAL_COMMUNITY)
Admission: RE | Admit: 2023-05-13 | Discharge: 2023-05-13 | Disposition: A | Discharge status: Home / Self Care | Payer: Medicaid Other | Source: Ambulatory Visit | Attending: Surgery

## 2023-05-13 ENCOUNTER — Encounter (HOSPITAL_COMMUNITY): Payer: Self-pay

## 2023-05-13 VITALS — BP 103/69 | HR 55 | Temp 97.7°F | Resp 16 | Ht 67.0 in | Wt 201.0 lb

## 2023-05-13 DIAGNOSIS — Z01818 Encounter for other preprocedural examination: Secondary | ICD-10-CM | POA: Diagnosis present

## 2023-05-13 DIAGNOSIS — R000 Tachycardia, unspecified: Secondary | ICD-10-CM | POA: Diagnosis not present

## 2023-05-13 LAB — POCT PREGNANCY, URINE: Preg Test, Ur: NEGATIVE

## 2023-05-13 NOTE — Patient Instructions (Signed)
Kaitlyn Ferrell  05/13/2023     @PREFPERIOPPHARMACY @   Your procedure is scheduled on 05/15/2023.  Report to [name] at 6:45 A.M.  Call this number if you have problems the morning of surgery:  [phone]  If you experience any cold or flu symptoms such as cough, fever, chills, shortness of breath, etc. between now and your scheduled surgery, please notify us at the above number.   Remember:  Do not eat anything after midnight.  You may drink clear liquids until 4:45am .  Clear liquids allowed are:                    Water, Juice (No red color; non-citric and without pulp; diabetics please choose diet or no sugar options), Clear Tea (No creamer, milk, or cream, including half & half and powdered creamer), and Clear Sports drink (No red color; diabetics please choose diet or no sugar options)    Take these medicines the morning of surgery with A SIP OF WATER : Prozac Lamictal and Zofran (if needed)    Do not wear jewelry, make-up or nail polish, including gel polish,  artificial nails, or any other type of covering on natural nails (fingers and  toes).  Do not wear lotions, powders, or perfumes, or deodorant.  Do not shave 48 hours prior to surgery.  Men may shave face and neck.  Do not bring valuables to the hospital.  [facility] is not responsible for any belongings or valuables.  Contacts, dentures or bridgework may not be worn into surgery.  Leave your suitcase in the car.  After surgery it may be brought to your room.  For patients admitted to the hospital, discharge time will be determined by your treatment team.  Patients discharged the day of surgery will not be allowed to drive home.   Name and phone number of your driver:   Family Special instructions:  N/a  Please read over the following fact sheets that you were given. Care and Recovery After Surgery  Minimally Invasive Cholecystectomy Minimally invasive cholecystectomy is surgery to remove the  gallbladder. The gallbladder is a pear-shaped organ that lies beneath the liver on the right side of the body. The gallbladder stores bile, which is a fluid that helps the body digest fats. Cholecystectomy is often done to treat inflammation (irritation and swelling) of the gallbladder (cholecystitis). This condition is usually caused by a buildup of gallstones (cholelithiasis) in the gallbladder or when the fluid in the gall bladder becomes stagnant because gallstones get stuck in the ducts (tubes) and block the flow of bile. This can result in inflammation and pain. In severe cases, emergency surgery may be required. This procedure is done through small incisions in the abdomen, instead of one large incision. It is also called laparoscopic surgery. A thin scope with a camera (laparoscope) is inserted through one incision. Then surgical instruments are inserted through the other incisions. In some cases, a minimally invasive surgery may need to be changed to a surgery that is done through a larger incision. This is called open surgery. Tell a health care provider about: Any allergies you have. All medicines you are taking, including vitamins, herbs, eye drops, creams, and over-the-counter medicines. Any problems you or family members have had with anesthetic medicines. Any bleeding problems you have. Any surgeries you have had. Any medical conditions you have. Whether you are pregnant or may be pregnant. What are the risks? Generally, this is a safe procedure.  However, problems may occur, including: Infection. Bleeding. Allergic reactions to medicines. Damage to nearby structures or organs. A gallstone remaining in the common bile duct. The common bile duct carries bile from the gallbladder to the small intestine. A bile leak from the liver or cystic duct after your gallbladder is removed. What happens before the procedure? When to stop eating and drinking Follow instructions from your health  care provider about what you may eat and drink before your procedure. These may include: 8 hours before the procedure Stop eating most foods. Do not eat meat, fried foods, or fatty foods. Eat only light foods, such as toast or crackers. All liquids are okay except energy drinks and alcohol. 6 hours before the procedure Stop eating. Drink only clear liquids, such as water, clear fruit juice, black coffee, plain tea, and sports drinks. Do not drink energy drinks or alcohol. 2 hours before the procedure Stop drinking all liquids. You may be allowed to take medicines with small sips of water. If you do not follow your health care provider's instructions, your procedure may be delayed or canceled. Medicines Ask your health care provider about: Changing or stopping your regular medicines. This is especially important if you are taking diabetes medicines or blood thinners. Taking medicines such as aspirin and ibuprofen. These medicines can thin your blood. Do not take these medicines unless your health care provider tells you to take them. Taking over-the-counter medicines, vitamins, herbs, and supplements. General instructions If you will be going home right after the procedure, plan to have a responsible adult: Take you home from the hospital or clinic. You will not be allowed to drive. Care for you for the time you are told. Do not use any products that contain nicotine or tobacco for at least 4 weeks before the procedure. These products include cigarettes, chewing tobacco, and vaping devices, such as e-cigarettes. If you need help quitting, ask your health care provider. Ask your health care provider: How your surgery site will be marked. What steps will be taken to help prevent infection. These may include: Removing hair at the surgery site. Washing skin with a germ-killing soap. Taking antibiotic medicine. What happens during the procedure?  An IV will be inserted into one of your  veins. You will be given one or both of the following: A medicine to help you relax (sedative). A medicine to make you fall asleep (general anesthetic). Your surgeon will make several small incisions in your abdomen. The laparoscope will be inserted through one of the small incisions. The camera on the laparoscope will send images to a monitor in the operating room. This lets your surgeon see inside your abdomen. A gas will be pumped into your abdomen. This will expand your abdomen to give the surgeon more room to perform the surgery. Other tools that are needed for the procedure will be inserted through the other incisions. The gallbladder will be removed through one of the incisions. Your common bile duct may be examined. If stones are found in the common bile duct, they may be removed. After your gallbladder has been removed, the incisions will be closed with stitches (sutures), staples, or skin glue. Your incisions will be covered with a bandage (dressing). The procedure may vary among health care providers and hospitals. What happens after the procedure? Your blood pressure, heart rate, breathing rate, and blood oxygen level will be monitored until you leave the hospital or clinic. You will be given medicines as needed to control your pain.  You may have a drain placed in the incision. The drain will be removed a day or two after the procedure. Summary Minimally invasive cholecystectomy, also called laparoscopic cholecystectomy, is surgery to remove the gallbladder using small incisions. Tell your health care provider about all the medical conditions you have and all the medicines you are taking for those conditions. Before the procedure, follow instructions about when to stop eating and drinking and changing or stopping medicines. Plan to have a responsible adult care for you for the time you are told after you leave the hospital or clinic. This information is not intended to replace advice  given to you by your health care provider. Make sure you discuss any questions you have with your health care provider. Document Revised: 12/13/2020 Document Reviewed: 12/13/2020 Elsevier Patient Education  2024 [employer]  General Anesthesia, Adult General anesthesia is the use of medicine to make you fall asleep (unconscious) for a medical procedure. General anesthesia must be used for certain procedures. It is often recommended for surgery or procedures that: Last a long time. Require you to be still or in an unusual position. Are major and can cause blood loss. Affect your breathing. The medicines used for general anesthesia are called general anesthetics. During general anesthesia, these medicines are given along with medicines that: Prevent pain. Control your blood pressure. Relax your muscles. Prevent nausea and vomiting after the procedure. Tell a health care provider about: Any allergies you have. All medicines you are taking, including vitamins, herbs, eye drops, creams, and over-the-counter medicines. Your history of any: Medical conditions you have, including: High blood pressure. Bleeding problems. Diabetes. Heart or lung conditions, such as: Heart failure. Sleep apnea. Asthma. Chronic obstructive pulmonary disease (COPD). Current or recent illnesses, such as: Upper respiratory, chest, or ear infections. Cough or fever. Tobacco or drug use, including marijuana or alcohol use. Depression or anxiety. Surgeries and types of anesthetics you have had. Problems you or family members have had with anesthetic medicines. Whether you are pregnant or may be pregnant. Whether you have any chipped or loose teeth, dentures, caps, bridgework, or issues with your mouth, swallowing, or choking. What are the risks? Your health care provider will talk with you about risks. These may include: Allergic reaction to the medicines. Lung and heart problems. Inhaling food or liquid  from the stomach into the lungs (aspiration). Nerve injury. Injury to the lips, mouth, teeth, or gums. Stroke. Waking up during your procedure and being unable to move. This is rare. These problems are more likely to develop if you are having a major surgery or if you have an advanced or serious medical condition. You can prevent some of these complications by answering all of your health care provider's questions thoroughly and by following all instructions before your procedure. General anesthesia can cause side effects, including: Nausea or vomiting. A sore throat or hoarseness from the breathing tube. Wheezing or coughing. Shaking chills or feeling cold. Body aches. Sleepiness. Confusion, agitation (delirium), or anxiety. What happens before the procedure? When to stop eating and drinking Follow instructions from your health care provider about what you may eat and drink before your procedure. If you do not follow your health care provider's instructions, your procedure may be delayed or canceled. Medicines Ask your health care provider about: Changing or stopping your regular medicines. These include any diabetes medicines or blood thinners you take. Taking medicines such as aspirin and ibuprofen. These medicines can thin your blood. Do not take them unless  your health care provider tells you to. Taking over-the-counter medicines, vitamins, herbs, and supplements. General instructions Do not use any products that contain nicotine or tobacco for at least 4 weeks before the procedure. These products include cigarettes, chewing tobacco, and vaping devices, such as e-cigarettes. If you need help quitting, ask your health care provider. If you brush your teeth on the morning of the procedure, make sure to spit out all of the water and toothpaste. If told by your health care provider, bring your sleep apnea device with you to surgery (if applicable). If you will be going home right after  the procedure, plan to have a responsible adult: Take you home from the hospital or clinic. You will not be allowed to drive. Care for you for the time you are told. What happens during the procedure?  An IV will be inserted into one of your veins. You will be given one or more of the following through a face mask or IV: A sedative. This helps you relax. Anesthesia. This will: Numb certain areas of your body. Make you fall asleep for surgery. After you are unconscious, a breathing tube may be inserted down your throat to help you breathe. This will be removed before you wake up. An anesthesia provider, such as an anesthesiologist, will stay with you throughout your procedure. The anesthesia provider will: Keep you comfortable and safe by continuing to give you medicines and adjusting the amount of medicine that you get. Monitor your blood pressure, heart rate, and oxygen levels to make sure that the anesthetics do not cause any problems. The procedure may vary among health care providers and hospitals. What happens after the procedure? Your blood pressure, temperature, heart rate, breathing rate, and blood oxygen level will be monitored until you leave the hospital or clinic. You will wake up in a recovery area. You may wake up slowly. You may be given medicine to help you with pain, nausea, or any other side effects from the anesthesia. Summary General anesthesia is the use of medicine to make you fall asleep (unconscious) for a medical procedure. Follow your health care provider's instructions about when to stop eating, drinking, or taking certain medicines before your procedure. Plan to have a responsible adult take you home from the hospital or clinic. This information is not intended to replace advice given to you by your health care provider. Make sure you discuss any questions you have with your health care provider. Document Revised: 09/07/2021 Document Reviewed: 09/07/2021 Elsevier  Patient Education  2024 [employer]   How to Use Chlorhexidine Before Surgery Chlorhexidine gluconate (CHG) is a germ-killing (antiseptic) solution that is used to clean the skin. It can get rid of the bacteria that normally live on the skin and can keep them away for about 24 hours. To clean your skin with CHG, you may be given: A CHG solution to use in the shower or as part of a sponge bath. A prepackaged cloth that contains CHG. Cleaning your skin with CHG may help lower the risk for infection: While you are staying in the intensive care unit of the hospital. If you have a vascular access, such as a central line, to provide short-term or long-term access to your veins. If you have a catheter to drain urine from your bladder. If you are on a ventilator. A ventilator is a machine that helps you breathe by moving air in and out of your lungs. After surgery. What are the risks? Risks of using  CHG include: A skin reaction. Hearing loss, if CHG gets in your ears and you have a perforated eardrum. Eye injury, if CHG gets in your eyes and is not rinsed out. The CHG product catching fire. Make sure that you avoid smoking and flames after applying CHG to your skin. Do not use CHG: If you have a chlorhexidine allergy or have previously reacted to chlorhexidine. On babies younger than 16 months of age. How to use CHG solution Use CHG only as told by your health care provider, and follow the instructions on the label. Use the full amount of CHG as directed. Usually, this is one bottle. During a shower Follow these steps when using CHG solution during a shower (unless your health care provider gives you different instructions): Start the shower. Use your normal soap and shampoo to wash your face and hair. Turn off the shower or move out of the shower stream. Pour the CHG onto a clean washcloth. Do not use any type of brush or rough-edged sponge. Starting at your neck, lather your body down to  your toes. Make sure you follow these instructions: If you will be having surgery, pay special attention to the part of your body where you will be having surgery. Scrub this area for at least 1 minute. Do not use CHG on your head or face. If the solution gets into your ears or eyes, rinse them well with water. Avoid your genital area. Avoid any areas of skin that have broken skin, cuts, or scrapes. Scrub your back and under your arms. Make sure to wash skin folds. Let the lather sit on your skin for 1-2 minutes or as long as told by your health care provider. Thoroughly rinse your entire body in the shower. Make sure that all body creases and crevices are rinsed well. Dry off with a clean towel. Do not put any substances on your body afterward--such as powder, lotion, or perfume--unless you are told to do so by your health care provider. Only use lotions that are recommended by the manufacturer. Put on clean clothes or pajamas. If it is the night before your surgery, sleep in clean sheets.  During a sponge bath Follow these steps when using CHG solution during a sponge bath (unless your health care provider gives you different instructions): Use your normal soap and shampoo to wash your face and hair. Pour the CHG onto a clean washcloth. Starting at your neck, lather your body down to your toes. Make sure you follow these instructions: If you will be having surgery, pay special attention to the part of your body where you will be having surgery. Scrub this area for at least 1 minute. Do not use CHG on your head or face. If the solution gets into your ears or eyes, rinse them well with water. Avoid your genital area. Avoid any areas of skin that have broken skin, cuts, or scrapes. Scrub your back and under your arms. Make sure to wash skin folds. Let the lather sit on your skin for 1-2 minutes or as long as told by your health care provider. Using a different clean, wet washcloth, thoroughly  rinse your entire body. Make sure that all body creases and crevices are rinsed well. Dry off with a clean towel. Do not put any substances on your body afterward--such as powder, lotion, or perfume--unless you are told to do so by your health care provider. Only use lotions that are recommended by the manufacturer. Put on clean clothes  or pajamas. If it is the night before your surgery, sleep in clean sheets. How to use CHG prepackaged cloths Only use CHG cloths as told by your health care provider, and follow the instructions on the label. Use the CHG cloth on clean, dry skin. Do not use the CHG cloth on your head or face unless your health care provider tells you to. When washing with the CHG cloth: Avoid your genital area. Avoid any areas of skin that have broken skin, cuts, or scrapes. Before surgery Follow these steps when using a CHG cloth to clean before surgery (unless your health care provider gives you different instructions): Using the CHG cloth, vigorously scrub the part of your body where you will be having surgery. Scrub using a back-and-forth motion for 3 minutes. The area on your body should be completely wet with CHG when you are done scrubbing. Do not rinse. Discard the cloth and let the area air-dry. Do not put any substances on the area afterward, such as powder, lotion, or perfume. Put on clean clothes or pajamas. If it is the night before your surgery, sleep in clean sheets.  For general bathing Follow these steps when using CHG cloths for general bathing (unless your health care provider gives you different instructions). Use a separate CHG cloth for each area of your body. Make sure you wash between any folds of skin and between your fingers and toes. Wash your body in the following order, switching to a new cloth after each step: The front of your neck, shoulders, and chest. Both of your arms, under your arms, and your hands. Your stomach and groin area, avoiding the  genitals. Your right leg and foot. Your left leg and foot. The back of your neck, your back, and your buttocks. Do not rinse. Discard the cloth and let the area air-dry. Do not put any substances on your body afterward--such as powder, lotion, or perfume--unless you are told to do so by your health care provider. Only use lotions that are recommended by the manufacturer. Put on clean clothes or pajamas. Contact a health care provider if: Your skin gets irritated after scrubbing. You have questions about using your solution or cloth. You swallow any chlorhexidine. Call your local poison control center ([phone] in the [place] Get help right away if: Your eyes itch badly, or they become very red or swollen. Your skin itches badly and is red or swollen. Your hearing changes. You have trouble seeing. You have swelling or tingling in your mouth or throat. You have trouble breathing. These symptoms may represent a serious problem that is an emergency. Do not wait to see if the symptoms will go away. Get medical help right away. Call your local emergency services (911 in the [place] Do not drive yourself to the hospital. Summary Chlorhexidine gluconate (CHG) is a germ-killing (antiseptic) solution that is used to clean the skin. Cleaning your skin with CHG may help to lower your risk for infection. You may be given CHG to use for bathing. It may be in a bottle or in a prepackaged cloth to use on your skin. Carefully follow your health care provider's instructions and the instructions on the product label. Do not use CHG if you have a chlorhexidine allergy. Contact your health care provider if your skin gets irritated after scrubbing. This information is not intended to replace advice given to you by your health care provider. Make sure you discuss any questions you have with your health care provider.  Document Revised: 10/09/2021 Document Reviewed: 08/22/2020 Elsevier Patient Education  2023  [employer].

## 2023-05-14 ENCOUNTER — Ambulatory Visit: Payer: Medicaid Other | Admitting: General Surgery

## 2023-05-15 ENCOUNTER — Ambulatory Visit (HOSPITAL_COMMUNITY): Payer: Medicaid Other | Admitting: Anesthesiology

## 2023-05-15 ENCOUNTER — Ambulatory Visit (HOSPITAL_BASED_OUTPATIENT_CLINIC_OR_DEPARTMENT_OTHER): Payer: Self-pay | Admitting: Anesthesiology

## 2023-05-15 ENCOUNTER — Encounter (HOSPITAL_COMMUNITY): Payer: Self-pay | Admitting: Surgery

## 2023-05-15 ENCOUNTER — Encounter (HOSPITAL_COMMUNITY)
Admission: RE | Disposition: A | Discharge status: Home / Self Care | Payer: Self-pay | Source: Home / Self Care | Attending: Surgery

## 2023-05-15 ENCOUNTER — Other Ambulatory Visit: Payer: Self-pay

## 2023-05-15 ENCOUNTER — Ambulatory Visit (HOSPITAL_COMMUNITY)
Admission: RE | Admit: 2023-05-15 | Discharge: 2023-05-15 | Disposition: A | Discharge status: Home / Self Care | Payer: Medicaid Other | Attending: Surgery | Admitting: Surgery

## 2023-05-15 DIAGNOSIS — K8050 Calculus of bile duct without cholangitis or cholecystitis without obstruction: Secondary | ICD-10-CM | POA: Diagnosis not present

## 2023-05-15 DIAGNOSIS — F419 Anxiety disorder, unspecified: Secondary | ICD-10-CM | POA: Diagnosis not present

## 2023-05-15 DIAGNOSIS — R112 Nausea with vomiting, unspecified: Secondary | ICD-10-CM

## 2023-05-15 DIAGNOSIS — I10 Essential (primary) hypertension: Secondary | ICD-10-CM | POA: Insufficient documentation

## 2023-05-15 DIAGNOSIS — E282 Polycystic ovarian syndrome: Secondary | ICD-10-CM | POA: Insufficient documentation

## 2023-05-15 DIAGNOSIS — K8044 Calculus of bile duct with chronic cholecystitis without obstruction: Secondary | ICD-10-CM | POA: Diagnosis present

## 2023-05-15 DIAGNOSIS — F32A Depression, unspecified: Secondary | ICD-10-CM | POA: Insufficient documentation

## 2023-05-15 DIAGNOSIS — K219 Gastro-esophageal reflux disease without esophagitis: Secondary | ICD-10-CM | POA: Insufficient documentation

## 2023-05-15 SURGERY — CHOLECYSTECTOMY, ROBOT-ASSISTED, LAPAROSCOPIC
Anesthesia: General | Site: Abdomen

## 2023-05-15 MED ORDER — LACTATED RINGERS IV SOLN: INTRAVENOUS | Status: DC

## 2023-05-15 MED ORDER — FENTANYL CITRATE (PF) 100 MCG/2ML IJ SOLN
INTRAMUSCULAR | Status: DC | PRN
  Administered 2023-05-15: 50 ug via INTRAVENOUS
  Administered 2023-05-15 (×3): 100 ug via INTRAVENOUS

## 2023-05-15 MED ORDER — DEXMEDETOMIDINE HCL IN NACL 80 MCG/20ML IV SOLN
INTRAVENOUS | Status: DC | PRN
  Administered 2023-05-15 (×2): 20 ug via INTRAVENOUS

## 2023-05-15 MED ORDER — CHLORHEXIDINE GLUCONATE CLOTH 2 % EX PADS: 6.0000 | MEDICATED_PAD | Freq: Once | CUTANEOUS | Status: DC

## 2023-05-15 MED ORDER — FENTANYL CITRATE PF 50 MCG/ML IJ SOSY
PREFILLED_SYRINGE | INTRAMUSCULAR | Status: AC
  Filled 2023-05-15: qty 1

## 2023-05-15 MED ORDER — FENTANYL CITRATE PF 50 MCG/ML IJ SOSY
25.0000 ug | PREFILLED_SYRINGE | INTRAMUSCULAR | Status: DC | PRN
Start: 2023-05-15 — End: 2023-05-15
  Administered 2023-05-15: 50 ug via INTRAVENOUS

## 2023-05-15 MED ORDER — BUPIVACAINE HCL (PF) 0.5 % IJ SOLN
INTRAMUSCULAR | Status: AC
  Filled 2023-05-15: qty 30

## 2023-05-15 MED ORDER — CLINDAMYCIN PHOSPHATE 900 MG/50ML IV SOLN
INTRAVENOUS | Status: AC
  Filled 2023-05-15: qty 50

## 2023-05-15 MED ORDER — CLINDAMYCIN PHOSPHATE 900 MG/50ML IV SOLN
900.0000 mg | INTRAVENOUS | Status: AC
  Administered 2023-05-15: 900 mg via INTRAVENOUS

## 2023-05-15 MED ORDER — LIDOCAINE HCL (CARDIAC) PF 100 MG/5ML IV SOSY
PREFILLED_SYRINGE | INTRAVENOUS | Status: DC | PRN
  Administered 2023-05-15: 60 mg via INTRAVENOUS

## 2023-05-15 MED ORDER — ONDANSETRON HCL 4 MG/2ML IJ SOLN: 4.0000 mg | Freq: Once | INTRAMUSCULAR | Status: DC | PRN

## 2023-05-15 MED ORDER — BUPIVACAINE HCL (PF) 0.5 % IJ SOLN
INTRAMUSCULAR | Status: DC | PRN
  Administered 2023-05-15: 30 mL

## 2023-05-15 MED ORDER — SCOPOLAMINE 1 MG/3DAYS TD PT72
MEDICATED_PATCH | TRANSDERMAL | Status: AC
  Filled 2023-05-15: qty 1

## 2023-05-15 MED ORDER — SUGAMMADEX SODIUM 500 MG/5ML IV SOLN
INTRAVENOUS | Status: DC | PRN
  Administered 2023-05-15: 400 mg via INTRAVENOUS

## 2023-05-15 MED ORDER — DOCUSATE SODIUM 100 MG PO CAPS: 100.0000 mg | ORAL_CAPSULE | Freq: Two times a day (BID) | ORAL | 2 refills | Status: DC

## 2023-05-15 MED ORDER — CHLORHEXIDINE GLUCONATE 0.12 % MT SOLN
15.0000 mL | Freq: Once | OROMUCOSAL | Status: AC
  Administered 2023-05-15: 15 mL via OROMUCOSAL

## 2023-05-15 MED ORDER — SCOPOLAMINE 1 MG/3DAYS TD PT72
1.0000 | MEDICATED_PATCH | TRANSDERMAL | Status: DC
  Administered 2023-05-15: 1.5 mg via TRANSDERMAL

## 2023-05-15 MED ORDER — OXYCODONE HCL 5 MG PO TABS
5.0000 mg | ORAL_TABLET | Freq: Four times a day (QID) | ORAL | 0 refills | Status: DC | PRN
Start: 2023-05-15 — End: 2023-05-17

## 2023-05-15 MED ORDER — PROPOFOL 10 MG/ML IV BOLUS
INTRAVENOUS | Status: AC
  Filled 2023-05-15: qty 20

## 2023-05-15 MED ORDER — ROCURONIUM BROMIDE 100 MG/10ML IV SOLN
INTRAVENOUS | Status: DC | PRN
  Administered 2023-05-15: 60 mg via INTRAVENOUS
  Administered 2023-05-15: 40 mg via INTRAVENOUS

## 2023-05-15 MED ORDER — ROCURONIUM BROMIDE 10 MG/ML (PF) SYRINGE
PREFILLED_SYRINGE | INTRAVENOUS | Status: AC
  Filled 2023-05-15: qty 10

## 2023-05-15 MED ORDER — ORAL CARE MOUTH RINSE: 15.0000 mL | Freq: Once | OROMUCOSAL | Status: AC

## 2023-05-15 MED ORDER — MIDAZOLAM HCL 5 MG/5ML IJ SOLN
INTRAMUSCULAR | Status: DC | PRN
  Administered 2023-05-15: 2 mg via INTRAVENOUS

## 2023-05-15 MED ORDER — OXYCODONE HCL 5 MG/5ML PO SOLN: 5.0000 mg | Freq: Once | ORAL | Status: DC | PRN

## 2023-05-15 MED ORDER — ONDANSETRON HCL 4 MG/2ML IJ SOLN
INTRAMUSCULAR | Status: AC
Start: 2023-05-15 — End: ?
  Filled 2023-05-15: qty 2

## 2023-05-15 MED ORDER — DEXAMETHASONE SODIUM PHOSPHATE 10 MG/ML IJ SOLN
INTRAMUSCULAR | Status: AC
  Filled 2023-05-15: qty 1

## 2023-05-15 MED ORDER — OXYCODONE HCL 5 MG PO TABS: 5.0000 mg | ORAL_TABLET | Freq: Once | ORAL | Status: DC | PRN

## 2023-05-15 MED ORDER — MIDAZOLAM HCL 2 MG/2ML IJ SOLN
INTRAMUSCULAR | Status: AC
  Filled 2023-05-15: qty 2

## 2023-05-15 MED ORDER — LIDOCAINE HCL (PF) 2 % IJ SOLN
INTRAMUSCULAR | Status: AC
  Filled 2023-05-15: qty 5

## 2023-05-15 MED ORDER — DEXAMETHASONE SODIUM PHOSPHATE 10 MG/ML IJ SOLN
INTRAMUSCULAR | Status: DC | PRN
  Administered 2023-05-15: 5 mg via INTRAVENOUS

## 2023-05-15 MED ORDER — STERILE WATER FOR IRRIGATION IR SOLN
Status: DC | PRN
  Administered 2023-05-15: 500 mL

## 2023-05-15 MED ORDER — ACETAMINOPHEN 500 MG PO TABS: 1000.0000 mg | ORAL_TABLET | Freq: Four times a day (QID) | ORAL | 0 refills | Status: AC

## 2023-05-15 MED ORDER — KETOROLAC TROMETHAMINE 30 MG/ML IJ SOLN
INTRAMUSCULAR | Status: AC
  Filled 2023-05-15: qty 1

## 2023-05-15 MED ORDER — PROPOFOL 10 MG/ML IV BOLUS
INTRAVENOUS | Status: DC | PRN
  Administered 2023-05-15: 180 mg via INTRAVENOUS

## 2023-05-15 MED ORDER — MIDAZOLAM HCL 2 MG/2ML IJ SOLN
1.0000 mg | INTRAMUSCULAR | Status: AC | PRN
  Administered 2023-05-15 (×2): 1 mg via INTRAVENOUS
  Filled 2023-05-15: qty 2

## 2023-05-15 MED ORDER — FENTANYL CITRATE (PF) 250 MCG/5ML IJ SOLN
INTRAMUSCULAR | Status: AC
  Filled 2023-05-15: qty 5

## 2023-05-15 MED ORDER — ONDANSETRON HCL 4 MG/2ML IJ SOLN
INTRAMUSCULAR | Status: DC | PRN
  Administered 2023-05-15: 4 mg via INTRAVENOUS

## 2023-05-15 MED ORDER — FENTANYL CITRATE (PF) 100 MCG/2ML IJ SOLN
INTRAMUSCULAR | Status: AC
  Filled 2023-05-15: qty 2

## 2023-05-15 MED ORDER — KETOROLAC TROMETHAMINE 30 MG/ML IJ SOLN
INTRAMUSCULAR | Status: DC | PRN
  Administered 2023-05-15: 30 mg via INTRAVENOUS

## 2023-05-15 SURGICAL SUPPLY — 42 items
BLADE SURG 15 STRL LF DISP TIS (BLADE) ×1 IMPLANT
CAUTERY HOOK MNPLR 1.6 DVNC XI (INSTRUMENTS) ×1 IMPLANT
CHLORAPREP W/TINT 26 (MISCELLANEOUS) ×1 IMPLANT
CLIP LIGATING HEM O LOK PURPLE (MISCELLANEOUS) ×1 IMPLANT
COVER LIGHT HANDLE STERIS (MISCELLANEOUS) IMPLANT
DEFOGGER SCOPE WARMER CLEARIFY (MISCELLANEOUS) ×1 IMPLANT
DERMABOND ADVANCED .7 DNX12 (GAUZE/BANDAGES/DRESSINGS) ×1 IMPLANT
DRAPE ARM DVNC X/XI (DISPOSABLE) ×4 IMPLANT
DRAPE COLUMN DVNC XI (DISPOSABLE) ×1 IMPLANT
DRSG VASELINE 3X18 (GAUZE/BANDAGES/DRESSINGS) IMPLANT
ELECT REM PT RETURN 9FT ADLT (ELECTROSURGICAL) ×1
ELECTRODE REM PT RTRN 9FT ADLT (ELECTROSURGICAL) ×1 IMPLANT
FORCEPS BPLR R/ABLATION 8 DVNC (INSTRUMENTS) ×1 IMPLANT
FORCEPS PROGRASP DVNC XI (FORCEP) ×1 IMPLANT
GLOVE BIOGEL PI IND STRL 6.5 (GLOVE) ×2 IMPLANT
GLOVE BIOGEL PI IND STRL 7.0 (GLOVE) ×3 IMPLANT
GLOVE BIOGEL PI IND STRL 8 (GLOVE) IMPLANT
GLOVE SURG SS PI 6.5 STRL IVOR (GLOVE) ×2 IMPLANT
GLOVE SURG SS PI 8.0 STRL IVOR (GLOVE) IMPLANT
GOWN STRL REUS W/TWL LRG LVL3 (GOWN DISPOSABLE) ×3 IMPLANT
GOWN STRL REUS W/TWL XL LVL3 (GOWN DISPOSABLE) IMPLANT
GRASPER SUT TROCAR 14GX15 (MISCELLANEOUS) ×1 IMPLANT
KIT TURNOVER KIT A (KITS) ×1 IMPLANT
MANIFOLD NEPTUNE II (INSTRUMENTS) ×1 IMPLANT
NDL HYPO 21X1.5 SAFETY (NEEDLE) ×1 IMPLANT
NDL INSUFFLATION 14GA 120MM (NEEDLE) ×1 IMPLANT
NEEDLE HYPO 21X1.5 SAFETY (NEEDLE) ×1 IMPLANT
NEEDLE INSUFFLATION 14GA 120MM (NEEDLE) ×1 IMPLANT
OBTURATOR OPTICAL STND 8 DVNC (TROCAR) ×1
OBTURATOR OPTICALSTD 8 DVNC (TROCAR) ×1 IMPLANT
PACK LAP CHOLE LZT030E (CUSTOM PROCEDURE TRAY) ×1 IMPLANT
PAD ARMBOARD 7.5X6 YLW CONV (MISCELLANEOUS) ×1 IMPLANT
POSITIONER HEAD 8X9X4 ADT (SOFTGOODS) ×1 IMPLANT
SEAL UNIV 5-12 XI (MISCELLANEOUS) ×3 IMPLANT
SET BASIN LINEN APH (SET/KITS/TRAYS/PACK) ×1 IMPLANT
SET TUBE DA VINCI INSUFFLATOR (TUBING) IMPLANT
SUT MNCRL AB 4-0 PS2 18 (SUTURE) ×2 IMPLANT
SUT VICRYL 0 AB UR-6 (SUTURE) IMPLANT
SYR 30ML LL (SYRINGE) ×1 IMPLANT
SYS RETRIEVAL 5MM INZII UNIV (BASKET) ×1
SYSTEM RETRIEVL 5MM INZII UNIV (BASKET) IMPLANT
WATER STERILE IRR 500ML POUR (IV SOLUTION) ×1 IMPLANT

## 2023-05-15 NOTE — Interval H&P Note (Signed)
History and Physical Interval Note:  05/15/2023 8:47 AM  Kaitlyn Ferrell  has presented today for surgery, with the diagnosis of BILIARY COLIC.  The various methods of treatment have been discussed with the patient and family. After consideration of risks, benefits and other options for treatment, the patient has consented to  Procedure(s): XI ROBOTIC ASSISTED LAPAROSCOPIC CHOLECYSTECTOMY (N/A) as a surgical intervention.  The patient's history has been reviewed, patient examined, no change in status, stable for surgery.  I have reviewed the patient's chart and labs.  Questions were answered to the patient's satisfaction.     Demiana Crumbley A Judiann Celia

## 2023-05-15 NOTE — Anesthesia Preprocedure Evaluation (Signed)
Anesthesia Evaluation  Patient identified by MRN, date of birth, ID band Patient awake    Reviewed: Allergy & Precautions, H&P , NPO status , Patient's Chart, lab work & pertinent test results, reviewed documented beta blocker date and time   Airway Mallampati: II  TM Distance: >3 FB Neck ROM: full    Dental no notable dental hx.    Pulmonary neg pulmonary ROS   Pulmonary exam normal breath sounds clear to auscultation       Cardiovascular Exercise Tolerance: Good hypertension, negative cardio ROS  Rhythm:regular Rate:Normal     Neuro/Psych  PSYCHIATRIC DISORDERS Anxiety Depression    negative neurological ROS  negative psych ROS   GI/Hepatic negative GI ROS, Neg liver ROS,GERD  ,,  Endo/Other  negative endocrine ROS    Renal/GU negative Renal ROS  negative genitourinary   Musculoskeletal   Abdominal   Peds  Hematology negative hematology ROS (+)   Anesthesia Other Findings   Reproductive/Obstetrics negative OB ROS                             Anesthesia Physical Anesthesia Plan  ASA: 2  Anesthesia Plan: General and General ETT   Post-op Pain Management:    Induction:   PONV Risk Score and Plan: Ondansetron  Airway Management Planned:   Additional Equipment:   Intra-op Plan:   Post-operative Plan:   Informed Consent: I have reviewed the patients History and Physical, chart, labs and discussed the procedure including the risks, benefits and alternatives for the proposed anesthesia with the patient or authorized representative who has indicated his/her understanding and acceptance.     Dental Advisory Given  Plan Discussed with: CRNA  Anesthesia Plan Comments:        Anesthesia Quick Evaluation

## 2023-05-15 NOTE — Anesthesia Procedure Notes (Signed)
Procedure Name: Intubation Date/Time: 05/15/2023 9:18 AM  Performed by: Lorin Glass, CRNAPre-anesthesia Checklist: Patient identified, Patient being monitored, Timeout performed, Emergency Drugs available and Suction available Patient Re-evaluated:Patient Re-evaluated prior to induction Oxygen Delivery Method: Circle System Utilized Preoxygenation: Pre-oxygenation with 100% oxygen Induction Type: IV induction Ventilation: Mask ventilation without difficulty Laryngoscope Size: Mac and 3 Grade View: Grade I Tube type: Oral Tube size: 7.0 mm Number of attempts: 1 Airway Equipment and Method: stylet Placement Confirmation: ETT inserted through vocal cords under direct vision, positive ETCO2 and breath sounds checked- equal and bilateral Secured at: 21 cm Tube secured with: Tape Dental Injury: Teeth and Oropharynx as per pre-operative assessment

## 2023-05-15 NOTE — Op Note (Signed)
[facility] Operative Note  05/15/23  Preoperative Diagnosis: Biliary colic   Postoperative Diagnosis: Same   Procedure(s) Performed: Robotic Assisted Laparoscopic Cholecystectomy   Surgeon: Theophilus Kinds, DO   Assistants: No qualified resident was available    Anesthesia: General endotracheal   Anesthesiologist: Windell Norfolk, MD    Specimens: Gallbladder   Estimated Blood Loss: Minimal   Blood Replacement: None    Complications: None   Wound Class: Clean contaminated   Operative Indications: The patient was found to have post-prandial abdominal pain with negative imaging findings.  Pain was also reproducible with HIDA scan.  We discussed the risk of the procedure including but not limited to bleeding, infection, injury to the common bile duct, bile leak, need for further procedures, chance of subtotal cholecystectomy.   Findings:  Non-inflamed gallbladder Critical view of safety noted All clips intact at the end of the case Adequate hemostasis   Procedure: The patient was taken to the operating room and placed supine. General endotracheal anesthesia was induced. Intravenous antibiotics were administered per protocol.  An orogastric tube positioned to decompress the stomach. The abdomen was prepared and draped in the usual sterile fashion. A time-out was completed verifying correct patient, procedure, site, positioning, and implant(s) and/or special equipment prior to beginning this procedure.  Veress needle was placed at the infraumbilical area.  Saline drop test was not successfully completed, so the abdomen was entered with optiview technique at the umbilical incision.  Insufflation was then started and no immediate increase in abdominal pressure.  The abdomen was inspected and no abnormalities or injuries were found.  Under direct vision, ports were placed in the following locations in a semi curvilinear position around the target of the  gallbladder: Two 8 mm ports on the patient's right each having 8cm clearance to the adjacent ports and one 8 mm port placed on the patient's left 8 cm from the umbilical port. Once ports were placed, the table was placed in the reverse Trendelenburg position with the right side up. The Xi platform was brought into the operative field and docked to the ports successfully.  An endoscope was placed through the umbilical port, prograsp through the most lateral right port, fenestrated bipolar to the port just right of the umbilicus, and then a hook cautery in the left port.  The dome of the gallbladder was grasped with prograsp and retracted over the dome of the liver. Adhesions between the gallbladder and omentum, duodenum and transverse colon were lysed via hook cautery. The infundibulum was grasped with the fenestrated grasper and retracted toward the right lower quadrant. This maneuver exposed Calot's triangle.  The peritoneum overlying the gallbladder infundibulum was then dissected and the cystic duct and cystic artery identified.  Critical view of safety with the liver bed clearly visible behind the duct and artery with no additional structures noted.  The cystic duct and cystic artery were doubly clipped and divided close to the gallbladder.    The gallbladder was then dissected from its peritoneal and liver bed attachments by electrocautery. Hemostasis was checked prior to removing the hook cautery.  The [employer] was undocked and moved out of the field.  A 5mm Endo Catch bag was then placed through the umbilical port and the gallbladder was removed.  The gallbladder was passed off the table as a specimen. There was no evidence of bleeding from the gallbladder fossa or cystic artery or leakage of the bile from the cystic duct stump. The umbilical port site closed  with a 0 vicryl with a PMI needle.  The abdomen was desufflated and secondary trocars were removed under direct vision. No bleeding was noted. Incisions  were localized with marcaine.  All skin incisions were closed with subcuticular sutures of 4-0 monocryl and dermabond.   Final inspection revealed acceptable hemostasis. All counts were correct at the end of the case. The patient was awakened from anesthesia and extubated without complication. The OG tube was removed.  The patient went to the PACU in stable condition.   Theophilus Kinds, DO [facility] [address] [place], [place] [address] [phone] (office)

## 2023-05-15 NOTE — Transfer of Care (Signed)
Immediate Anesthesia Transfer of Care Note  Patient: Kaitlyn Ferrell  Procedure(s) Performed: XI ROBOTIC ASSISTED LAPAROSCOPIC CHOLECYSTECTOMY (Abdomen)  Patient Location: PACU  Anesthesia Type:General  Level of Consciousness: awake, drowsy, and patient cooperative  Airway & Oxygen Therapy: Patient Spontanous Breathing and Patient connected to face mask oxygen  Post-op Assessment: Report given to RN, Post -op Vital signs reviewed and stable, and Patient moving all extremities X 4  Post vital signs: Reviewed and stable  Last Vitals:  Vitals Value Taken Time  BP 99/43 05/15/23 1108  Temp 36.9 C 05/15/23 1108  Pulse 67 05/15/23 1111  Resp 22 05/15/23 1111  SpO2 100 % 05/15/23 1111  Vitals shown include unfiled device data.  Last Pain:  Vitals:   05/15/23 0818  TempSrc: Oral  PainSc:       Patients Stated Pain Goal: 5 (05/15/23 0729)  Complications: No notable events documented.

## 2023-05-15 NOTE — Progress Notes (Signed)
[facility]  Spoke with the patient's mother in the consultation room.  I explained that she tolerated the procedure without difficulty.  She has dissolvable stitches under the skin with overlying skin glue.  This will flake off in 10 to 14 days.  I discharged her home with a prescription for narcotic pain medication that they should take as needed for pain.  I also want her taking scheduled Tylenol.  If they take the narcotic pain medication, they should take a stool softener as well.  The patient will follow-up with me in 2 weeks for phone follow-up.  All questions were answered to her expressed satisfaction.  Graciella Freer, DO [facility] [address] [place], [place] [address] [phone] (office)

## 2023-05-15 NOTE — Discharge Instructions (Signed)
Ambulatory Surgery Discharge Instructions  General Anesthesia or Sedation Do not drive or operate heavy machinery for 24 hours.  Do not consume alcohol, tranquilizers, sleeping medications, or any non-prescribed medications for 24 hours. Do not make important decisions or sign any important papers in the next 24 hours. You should have someone with you tonight at home.  Activity  You are advised to go directly home from the hospital.  Restrict your activities and rest for a day.  Resume light activity tomorrow. No heavy lifting over 10 lbs or strenuous exercise.  Fluids and Diet Begin with clear liquids, bouillon, dry toast, soda crackers.  If not nauseated, you may go to a regular diet when you desire.  Greasy and spicy foods are not advised.  Medications  If you have not had a bowel movement in 24 hours, take 2 tablespoons over the counter Milk of mag.             You May resume your blood thinners tomorrow (Aspirin, coumadin, or other).  You are being discharged with prescriptions for Opioid/Narcotic Medications: There are some specific considerations for these medications that you should know. Opioid Meds have risks & benefits. Addiction to these meds is always a concern with prolonged use Take medication only as directed Do not drive while taking narcotic pain medication Do not crush tablets or capsules Do not use a different container than medication was dispensed in Lock the container of medication in a cool, dry place out of reach of children and pets. Opioid medication can cause addiction Do not share with anyone else (this is a felony) Do not store medications for future use. Dispose of them properly.     Disposal:  Find a [facility] household drug take back site near you.  If you can't get to a drug take back site, use the recipe below as a last resort to dispose of expired, unused or unwanted drugs. Disposal  (Do not dispose chemotherapy drugs this way, talk to your  prescribing doctor instead.) Step 1: Mix drugs (do not crush) with dirt, kitty litter, or used coffee grounds and add a small amount of water to dissolve any solid medications. Step 2: Seal drugs in plastic bag. Step 3: Place plastic bag in trash. Step 4: Take prescription container and scratch out personal information, then recycle or throw away.  Operative Site  You have a liquid bandage over your incisions, this will begin to flake off in about a week. Ok to shower tomorrow. Keep wound clean and dry. No baths or swimming. No lifting more than 10 pounds.  Contact Information: If you have questions or concerns, please call our office, [phone], Monday- Thursday 8AM-5PM and Friday 8AM-12Noon.  If it is after hours or on the weekend, please call [facility] Main Number, [phone], and ask to speak to the surgeon on call for Dr. Bahar Shelden at [facility].   SPECIFIC COMPLICATIONS TO WATCH FOR: Inability to urinate Fever over 101? F by mouth Nausea and vomiting lasting longer than 24 hours. Pain not relieved by medication ordered Swelling around the operative site Increased redness, warmth, hardness, around operative area Numbness, tingling, or cold fingers or toes Blood -soaked dressing, (small amounts of oozing may be normal) Increasing and progressive drainage from surgical area or exam site  

## 2023-05-16 LAB — SURGICAL PATHOLOGY

## 2023-05-16 NOTE — Anesthesia Postprocedure Evaluation (Signed)
Anesthesia Post Note  Patient: Kaitlyn Ferrell  Procedure(s) Performed: XI ROBOTIC ASSISTED LAPAROSCOPIC CHOLECYSTECTOMY (Abdomen)  Patient location during evaluation: Phase II Anesthesia Type: General Level of consciousness: awake Pain management: pain level controlled Vital Signs Assessment: post-procedure vital signs reviewed and stable Respiratory status: spontaneous breathing and respiratory function stable Cardiovascular status: blood pressure returned to baseline and stable Postop Assessment: no headache and no apparent nausea or vomiting Anesthetic complications: no Comments: Late entry   No notable events documented.   Last Vitals:  Vitals:   05/15/23 1245 05/15/23 1254  BP: (!) 103/59 110/69  Pulse: (!) 46 (!) 51  Resp: 13 17  Temp:  37 C  SpO2: 94% 95%    Last Pain:  Vitals:   05/15/23 1254  TempSrc: Oral  PainSc: 1                  Windell Norfolk

## 2023-05-17 ENCOUNTER — Telehealth: Payer: Self-pay | Admitting: Family Medicine

## 2023-05-17 ENCOUNTER — Other Ambulatory Visit: Payer: Self-pay | Admitting: Surgery

## 2023-05-17 DIAGNOSIS — K8050 Calculus of bile duct without cholangitis or cholecystitis without obstruction: Secondary | ICD-10-CM

## 2023-05-17 MED ORDER — OXYCODONE HCL 5 MG PO TABS: 5.0000 mg | ORAL_TABLET | Freq: Four times a day (QID) | ORAL | 0 refills | Status: DC | PRN

## 2023-05-17 NOTE — Telephone Encounter (Signed)
Patient called and states that the oxycodone was not helping with her pain. She is taking it when it gets really bad and using Tylenol q 6 hours. She is feeling most of her pain in her belly button area. It is a little red, swollen and painful. She has not taken any ibuprofen. She has one oxy left and would like a refill if not going to give her anything stronger.  Per Dr. Robyne Peers - she will refill oxy and she should rotate tyenol and ibuprofen and use oxy for breakthrough pain. Also inform patient that it will not take the pain completely away and she will be red and bruised in that area but if it gets more painful and red or if she starts running a fever to call us back.  Patient aware of provider recommendations and verbalizes understanding.

## 2023-05-28 ENCOUNTER — Ambulatory Visit (INDEPENDENT_AMBULATORY_CARE_PROVIDER_SITE_OTHER): Payer: Medicaid Other | Admitting: Surgery

## 2023-05-28 DIAGNOSIS — Z09 Encounter for follow-up examination after completed treatment for conditions other than malignant neoplasm: Secondary | ICD-10-CM

## 2023-05-28 NOTE — Progress Notes (Addendum)
[facility]  I am calling the patient for post operative evaluation. This is not a billable encounter as it is under the global charges for the surgery.  The patient had a robotic assisted laparoscopic cholecystectomy on 11/20. The patient reports that she is doing well. She is tolerating a diet, having good pain control, and having regular Bms.  The incisions are healing well.  She still has some glue at her incisions sites.  Advised that she can put antibiotic ointment at her incision prior to showering to help get off the remaining skin glue. The patient has no concerns.   Pathology: A. GALLBLADDER, CHOLECYSTECTOMY:       Chronic cholecystitis.   Will see the patient PRN.   Theophilus Kinds, DO [facility] [address] [place], [place] [address] [phone] (office)

## 2023-06-03 MED ORDER — ONDANSETRON 4 MG PO TBDP: 4.0000 mg | ORAL_TABLET | Freq: Three times a day (TID) | ORAL | 3 refills | Status: DC | PRN

## 2023-06-11 ENCOUNTER — Telehealth: Payer: Medicaid Other | Admitting: Gastroenterology

## 2023-06-11 DIAGNOSIS — R112 Nausea with vomiting, unspecified: Secondary | ICD-10-CM

## 2023-06-11 DIAGNOSIS — R110 Nausea: Secondary | ICD-10-CM | POA: Diagnosis not present

## 2023-06-11 DIAGNOSIS — R109 Unspecified abdominal pain: Secondary | ICD-10-CM

## 2023-06-11 DIAGNOSIS — K625 Hemorrhage of anus and rectum: Secondary | ICD-10-CM | POA: Diagnosis not present

## 2023-06-11 DIAGNOSIS — R1030 Lower abdominal pain, unspecified: Secondary | ICD-10-CM

## 2023-06-11 DIAGNOSIS — R197 Diarrhea, unspecified: Secondary | ICD-10-CM

## 2023-06-11 DIAGNOSIS — K760 Fatty (change of) liver, not elsewhere classified: Secondary | ICD-10-CM

## 2023-06-11 NOTE — Patient Instructions (Signed)
We are arranging a CT scan in the near future!  Please have blood work done.  I have sent in dicyclomine to take as needed for abdominal cramping and looser stool. Monitor for constipation or dry mouth, dizziness with this.   I have also sent in phenergan to take for significant nausea.   We will ultimately set up a colonoscopy in the near future!  We can review the CT scan first and then arrange gastric emptying study.    I enjoyed seeing you again today! I value our relationship and want to provide genuine, compassionate, and quality care. You may receive a survey regarding your visit with me, and I welcome your feedback! Thanks so much for taking the time to complete this. I look forward to seeing you again.      [name] Mink, PhD, ANP-BC [facility]

## 2023-06-11 NOTE — Progress Notes (Unsigned)
history of abdominal pain, bloating, nausea, and vomiting.    She has had thorough evaluation including endoscopy 01/08/2023 which showed mild gastritis, biopsies negative for H. pylori.  Mid esophageal biopsies with evidence of reflux esophagitis, duodenal biopsies WNL.   Right upper quadrant ultrasound 01/03/2023 showed mild hepatic steatosis, no cholelithiasis or evidence of cholecystitis.   HIDA scan Sept 2024 with reproducible symptoms. Negative CBC, CMP, lipase, celiac serologies.     Continued nausea since surgery. Sporadic episodes of feeling like twisting in lower abdomen and twisting in knots. No diarrhea. No constipation. Typically one BM a day lately. Alternating between constipation and diarrhea. Will sometimes see small amount of rectal bleeding.   Stopped pantoprazole as no improvement.   Waking up with nausea. Rare vomiting, just 1 or 2 times after surgery and was choking. Nothing really improves nausea.

## 2023-06-12 ENCOUNTER — Telehealth (INDEPENDENT_AMBULATORY_CARE_PROVIDER_SITE_OTHER): Payer: Self-pay | Admitting: Gastroenterology

## 2023-06-12 ENCOUNTER — Encounter (INDEPENDENT_AMBULATORY_CARE_PROVIDER_SITE_OTHER): Payer: Self-pay

## 2023-06-12 NOTE — Telephone Encounter (Signed)
CT approved via insurance. Auth number [id number]. Valid dates 06/12/23-07/12/23. Pt scheduled for 06/18/23 at 10;30am over at [facility]. Pt is needing to arrive at 8:15am to begin contrast. Left message to return call and will send my chart message.

## 2023-06-17 ENCOUNTER — Telehealth: Payer: Self-pay

## 2023-06-17 MED ORDER — PROMETHAZINE HCL 12.5 MG PO TABS: 12.5000 mg | ORAL_TABLET | Freq: Four times a day (QID) | ORAL | 0 refills | Status: AC | PRN

## 2023-06-17 MED ORDER — DICYCLOMINE HCL 10 MG PO CAPS: 10.0000 mg | ORAL_CAPSULE | Freq: Three times a day (TID) | ORAL | 0 refills | Status: AC

## 2023-06-17 NOTE — Addendum Note (Signed)
Addended by: Tiffany Kocher on: 06/17/2023 01:29 PM   Modules accepted: Orders

## 2023-06-17 NOTE — Telephone Encounter (Signed)
Rx done, no refills. Future refills from [place].

## 2023-06-17 NOTE — Telephone Encounter (Signed)
Pt is aware.  

## 2023-06-17 NOTE — Telephone Encounter (Signed)
Pt called regarding medications that Tobi Bastos was supposed to have sent in last week. According to note dicyclomine and phenergan was going to be called in but doesn't look like they have. Routing to you in Anna's absence.

## 2023-06-18 ENCOUNTER — Ambulatory Visit (HOSPITAL_COMMUNITY)
Admission: RE | Admit: 2023-06-18 | Discharge: 2023-06-18 | Disposition: A | Discharge status: Home / Self Care | Payer: Medicaid Other | Source: Ambulatory Visit | Attending: Gastroenterology | Admitting: Gastroenterology

## 2023-06-18 DIAGNOSIS — R109 Unspecified abdominal pain: Secondary | ICD-10-CM | POA: Insufficient documentation

## 2023-06-18 MED ORDER — IOHEXOL 300 MG/ML  SOLN
100.0000 mL | Freq: Once | INTRAMUSCULAR | Status: AC | PRN
  Administered 2023-06-18: 100 mL via INTRAVENOUS

## 2023-06-21 ENCOUNTER — Encounter: Payer: Self-pay | Admitting: *Deleted

## 2023-06-21 ENCOUNTER — Other Ambulatory Visit: Payer: Self-pay | Admitting: *Deleted

## 2023-06-21 DIAGNOSIS — K625 Hemorrhage of anus and rectum: Secondary | ICD-10-CM

## 2023-06-21 MED ORDER — CLENPIQ 10-3.5-12 MG-GM -GM/175ML PO SOLN: 1.0000 | ORAL | 0 refills | Status: DC

## 2023-06-21 NOTE — Addendum Note (Signed)
Addended by: Elinor Dodge on: 06/21/2023 11:34 AM   Modules accepted: Orders

## 2023-06-24 ENCOUNTER — Encounter: Payer: Self-pay | Admitting: *Deleted

## 2023-07-03 ENCOUNTER — Telehealth: Payer: Self-pay

## 2023-07-03 NOTE — Telephone Encounter (Signed)
 Pt phoned and advised that in spite of her taking her promethazine  she has been up since 5 am vomiting straight acid. Pt is requesting a acid reducer to take. I asked the pt is she on one now and she responded no, that a Dr here took her off of it. Please call or respond

## 2023-07-04 MED ORDER — OMEPRAZOLE 40 MG PO CPDR: 40.0000 mg | DELAYED_RELEASE_CAPSULE | Freq: Every day | ORAL | 3 refills | Status: DC

## 2023-07-04 NOTE — Addendum Note (Signed)
 Addended by: [name] Mink on: 07/04/2023 02:10 PM   Modules accepted: Orders

## 2023-07-04 NOTE — Telephone Encounter (Signed)
 Addressed in mychart.

## 2023-07-23 ENCOUNTER — Other Ambulatory Visit: Payer: Self-pay

## 2023-07-23 ENCOUNTER — Ambulatory Visit (HOSPITAL_COMMUNITY)
Admission: RE | Admit: 2023-07-23 | Discharge: 2023-07-23 | Disposition: A | Discharge status: Home / Self Care | Payer: Medicaid Other | Attending: Internal Medicine | Admitting: Internal Medicine

## 2023-07-23 ENCOUNTER — Ambulatory Visit (HOSPITAL_COMMUNITY): Payer: Medicaid Other | Admitting: Anesthesiology

## 2023-07-23 ENCOUNTER — Telehealth: Payer: Self-pay | Admitting: Gastroenterology

## 2023-07-23 ENCOUNTER — Encounter (HOSPITAL_COMMUNITY): Payer: Self-pay | Admitting: Internal Medicine

## 2023-07-23 ENCOUNTER — Encounter (HOSPITAL_COMMUNITY)
Admission: RE | Disposition: A | Discharge status: Home / Self Care | Payer: Self-pay | Source: Home / Self Care | Attending: Internal Medicine

## 2023-07-23 DIAGNOSIS — F32A Depression, unspecified: Secondary | ICD-10-CM | POA: Insufficient documentation

## 2023-07-23 DIAGNOSIS — K219 Gastro-esophageal reflux disease without esophagitis: Secondary | ICD-10-CM | POA: Diagnosis not present

## 2023-07-23 DIAGNOSIS — K625 Hemorrhage of anus and rectum: Secondary | ICD-10-CM

## 2023-07-23 DIAGNOSIS — K648 Other hemorrhoids: Secondary | ICD-10-CM

## 2023-07-23 DIAGNOSIS — F419 Anxiety disorder, unspecified: Secondary | ICD-10-CM | POA: Insufficient documentation

## 2023-07-23 DIAGNOSIS — R1030 Lower abdominal pain, unspecified: Secondary | ICD-10-CM | POA: Insufficient documentation

## 2023-07-23 DIAGNOSIS — I10 Essential (primary) hypertension: Secondary | ICD-10-CM | POA: Diagnosis not present

## 2023-07-23 HISTORY — PX: COLONOSCOPY WITH PROPOFOL: SHX5780

## 2023-07-23 HISTORY — PX: BIOPSY: SHX5522

## 2023-07-23 LAB — POCT PREGNANCY, URINE: Preg Test, Ur: NEGATIVE

## 2023-07-23 SURGERY — COLONOSCOPY WITH PROPOFOL
Anesthesia: General

## 2023-07-23 MED ORDER — PROPOFOL 500 MG/50ML IV EMUL
INTRAVENOUS | Status: DC | PRN
  Administered 2023-07-23: 250 ug/kg/min via INTRAVENOUS

## 2023-07-23 MED ORDER — LACTATED RINGERS IV SOLN: INTRAVENOUS | Status: DC | PRN

## 2023-07-23 MED ORDER — PROPOFOL 10 MG/ML IV BOLUS
INTRAVENOUS | Status: DC | PRN
  Administered 2023-07-23: 100 mg via INTRAVENOUS

## 2023-07-23 MED ORDER — STERILE WATER FOR IRRIGATION IR SOLN
Status: DC | PRN
  Administered 2023-07-23: 40 mL

## 2023-07-23 NOTE — Anesthesia Preprocedure Evaluation (Addendum)
Anesthesia Evaluation  Patient identified by MRN, date of birth, ID band Patient awake    Reviewed: Allergy & Precautions, H&P , NPO status , Patient's Chart, lab work & pertinent test results, reviewed documented beta blocker date and time   Airway Mallampati: II  TM Distance: >3 FB Neck ROM: full    Dental no notable dental hx.    Pulmonary neg pulmonary ROS   Pulmonary exam normal breath sounds clear to auscultation       Cardiovascular Exercise Tolerance: Good hypertension, negative cardio ROS  Rhythm:regular Rate:Normal     Neuro/Psych  PSYCHIATRIC DISORDERS Anxiety Depression    negative neurological ROS  negative psych ROS   GI/Hepatic negative GI ROS, Neg liver ROS,GERD  ,,  Endo/Other  negative endocrine ROS    Renal/GU negative Renal ROS  negative genitourinary   Musculoskeletal   Abdominal   Peds  Hematology negative hematology ROS (+)   Anesthesia Other Findings   Reproductive/Obstetrics negative OB ROS                             Anesthesia Physical Anesthesia Plan  ASA: 2  Anesthesia Plan: General   Post-op Pain Management:    Induction:   PONV Risk Score and Plan: Propofol infusion  Airway Management Planned:   Additional Equipment:   Intra-op Plan:   Post-operative Plan:   Informed Consent: I have reviewed the patients History and Physical, chart, labs and discussed the procedure including the risks, benefits and alternatives for the proposed anesthesia with the patient or authorized representative who has indicated his/her understanding and acceptance.     Dental Advisory Given  Plan Discussed with: CRNA  Anesthesia Plan Comments:        Anesthesia Quick Evaluation

## 2023-07-23 NOTE — Op Note (Signed)
[facility] Patient Name: Kaitlyn Ferrell Procedure Date: 07/23/2023 10:39 AM MRN: [id number] Date of Birth: 2001-05-02 Attending MD: [name]. Marletta Lor , [place], [phone] CSN: [id number] Age: 23 Admit Type: Outpatient Procedure:                Colonoscopy Indications:              Lower abdominal pain, Rectal bleeding Providers:                [name]. Marletta Lor, DO, Crystal Page, Durwin Glaze Tech, Technician Referring MD:              Medicines:                See the Anesthesia note for documentation of the                            administered medications Complications:            No immediate complications. Estimated Blood Loss:     Estimated blood loss was minimal. Procedure:                Pre-Anesthesia Assessment:                           - The anesthesia plan was to use monitored                            anesthesia care (MAC).                           After obtaining informed consent, the colonoscope                            was passed under direct vision. Throughout the                            procedure, the patient's blood pressure, pulse, and                            oxygen saturations were monitored continuously. The                            [id number]-HQ190L ([id number]) scope was introduced through                            the anus and advanced to the the terminal ileum,                            with identification of the appendiceal orifice and                            IC valve. The colonoscopy was performed without                            difficulty. The patient tolerated  the procedure                            well. The quality of the bowel preparation was                            evaluated using the BBPS [facility] Bowel Preparation                            Scale) with scores of: Right Colon = 2 (minor                            amount of residual staining, small fragments of                            stool and/or  opaque liquid, but mucosa seen well),                            Transverse Colon = 3 (entire mucosa seen well with                            no residual staining, small fragments of stool or                            opaque liquid) and Left Colon = 2 (minor amount of                            residual staining, small fragments of stool and/or                            opaque liquid, but mucosa seen well). The total                            BBPS score equals 7. The quality of the bowel                            preparation was good. Scope In: 11:08:41 AM Scope Out: 11:21:21 AM Scope Withdrawal Time: 0 hours 10 minutes 0 seconds  Total Procedure Duration: 0 hours 12 minutes 40 seconds  Findings:      Non-bleeding internal hemorrhoids were found during retroflexion.      The terminal ileum appeared normal. Biopsies were taken with a cold       forceps for histology.      The entire examined colon appeared normal. Impression:               - Non-bleeding internal hemorrhoids.                           - The examined portion of the ileum was normal.                            Biopsied.                           -  The entire examined colon is normal. Moderate Sedation:      Per Anesthesia Care Recommendation:           - Patient has a contact number available for                            emergencies. The signs and symptoms of potential                            delayed complications were discussed with the                            patient. Return to normal activities tomorrow.                            Written discharge instructions were provided to the                            patient.                           - Resume previous diet.                           - Continue present medications.                           - Await pathology results.                           - Repeat colonoscopy at age 23 or sooner if higher                            risk for screening  purposes.                           - Return to GI clinic in 4 weeks. Procedure Code(s):        --- Professional ---                           [phone], Colonoscopy, flexible; with biopsy, single                            or multiple Diagnosis Code(s):        --- Professional ---                           K64.8, Other hemorrhoids                           R10.30, Lower abdominal pain, unspecified                           K62.5, Hemorrhage of anus and rectum CPT copyright 2022 American Medical Association. All rights reserved. The codes documented in this report are preliminary and upon coder review may  be revised to meet current compliance requirements. [name]. Marletta Lor, DO  [name]. Marletta Lor, DO 07/23/2023 11:24:35 AM This report has been signed electronically. Number of Addenda: 0

## 2023-07-23 NOTE — OR Nursing (Signed)
Kaitlyn Ferrell was at [facility] for a procedure 07/23/2023. She may return to work at 1:00 PM tomorrow.

## 2023-07-23 NOTE — Transfer of Care (Signed)
Immediate Anesthesia Transfer of Care Note  Patient: Kaitlyn Ferrell  Procedure(s) Performed: COLONOSCOPY WITH PROPOFOL BIOPSY  Patient Location: Short Stay  Anesthesia Type:General  Level of Consciousness: awake, alert , oriented, and patient cooperative  Airway & Oxygen Therapy: Patient Spontanous Breathing  Post-op Assessment: Report given to RN, Post -op Vital signs reviewed and stable, and Patient moving all extremities X 4  Post vital signs: Reviewed and stable  Last Vitals:  Vitals Value Taken Time  BP 97/49 07/23/23 1126  Temp 36.5 C 07/23/23 1126  Pulse 77 07/23/23 1126  Resp 21 07/23/23 1126  SpO2 100 % 07/23/23 1126    Last Pain:  Vitals:   07/23/23 1126  TempSrc: Axillary  PainSc: 0-No pain         Complications: No notable events documented.

## 2023-07-23 NOTE — Discharge Instructions (Addendum)
  Colonoscopy Discharge Instructions  Read the instructions outlined below and refer to this sheet in the next few weeks. These discharge instructions provide you with general information on caring for yourself after you leave the hospital. Your doctor may also give you specific instructions. While your treatment has been planned according to the most current medical practices available, unavoidable complications occasionally occur.   ACTIVITY You may resume your regular activity, but move at a slower pace for the next 24 hours.  Take frequent rest periods for the next 24 hours.  Walking will help get rid of the air and reduce the bloated feeling in your belly (abdomen).  No driving for 24 hours (because of the medicine (anesthesia) used during the test).   Do not sign any important legal documents or operate any machinery for 24 hours (because of the anesthesia used during the test).  NUTRITION Drink plenty of fluids.  You may resume your normal diet as instructed by your doctor.  Begin with a light meal and progress to your normal diet. Heavy or fried foods are harder to digest and may make you feel sick to your stomach (nauseated).  Avoid alcoholic beverages for 24 hours or as instructed.  MEDICATIONS You may resume your normal medications unless your doctor tells you otherwise.  WHAT YOU CAN EXPECT TODAY Some feelings of bloating in the abdomen.  Passage of more gas than usual.  Spotting of blood in your stool or on the toilet paper.  IF YOU HAD POLYPS REMOVED DURING THE COLONOSCOPY: No aspirin products for 7 days or as instructed.  No alcohol for 7 days or as instructed.  Eat a soft diet for the next 24 hours.  FINDING OUT THE RESULTS OF YOUR TEST Not all test results are available during your visit. If your test results are not back during the visit, make an appointment with your caregiver to find out the results. Do not assume everything is normal if you have not heard from your  caregiver or the medical facility. It is important for you to follow up on all of your test results.  SEEK IMMEDIATE MEDICAL ATTENTION IF: You have more than a spotting of blood in your stool.  Your belly is swollen (abdominal distention).  You are nauseated or vomiting.  You have a temperature over 101.  You have abdominal pain or discomfort that is severe or gets worse throughout the day.   Overall, your colon appeared very healthy.  I did not see any active inflammation indicative of underlying inflammatory bowel disease such as Crohn's disease or ulcerative colitis throughout your colon or end portion of your small bowel.  I took biopsies of the end portion of your small bowel to screen for Crohn's disease.  Await pathology results, my office will contact you.  I want you to start taking your dicyclomine twice daily regardless.  If you start to have constipation then would decrease down to once daily.  Follow-up in GI office in 3 to 4 weeks. A message was sent to the office about this appointment.    I hope you have a great rest of your week!  [name]. Marletta Lor, D.O. Gastroenterology and Hepatology [facility]
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
## 2023-07-23 NOTE — Telephone Encounter (Signed)
I scheduled the patient for a post-procedure follow up appt per Dr. Marletta Lor with you and she is wanting to know if it can be a virtual visit?  She has Medicaid so I will check to make sure they will cover it.

## 2023-07-24 ENCOUNTER — Encounter (HOSPITAL_COMMUNITY): Payer: Self-pay | Admitting: Internal Medicine

## 2023-07-24 LAB — SURGICAL PATHOLOGY

## 2023-07-24 NOTE — Anesthesia Postprocedure Evaluation (Signed)
Anesthesia Post Note  Patient: Kaitlyn Ferrell  Procedure(s) Performed: COLONOSCOPY WITH PROPOFOL BIOPSY  Patient location during evaluation: Phase II Anesthesia Type: General Level of consciousness: awake Pain management: pain level controlled Vital Signs Assessment: post-procedure vital signs reviewed and stable Respiratory status: spontaneous breathing and respiratory function stable Cardiovascular status: blood pressure returned to baseline and stable Postop Assessment: no headache and no apparent nausea or vomiting Anesthetic complications: no Comments: Late entry   No notable events documented.   Last Vitals:  Vitals:   07/23/23 1126 07/23/23 1131  BP: (!) 97/49 108/68  Pulse: 77   Resp: (!) 21   Temp: 36.5 C   SpO2: 100%     Last Pain:  Vitals:   07/23/23 1126  TempSrc: Axillary  PainSc: 0-No pain                 Windell Norfolk

## 2023-07-24 NOTE — Telephone Encounter (Signed)
Thats fine with me

## 2023-07-31 ENCOUNTER — Ambulatory Visit
Admission: EM | Admit: 2023-07-31 | Discharge: 2023-07-31 | Discharge status: Against Medical Advice | Payer: Medicaid Other | Attending: Family Medicine | Admitting: Family Medicine

## 2023-07-31 NOTE — ED Notes (Signed)
 Called pt from lobby & phone x1 unable to reach.

## 2023-07-31 NOTE — ED Notes (Signed)
 Called pt from lobby & phone x2 unable to reach.

## 2023-08-09 DIAGNOSIS — R112 Nausea with vomiting, unspecified: Secondary | ICD-10-CM

## 2023-08-12 NOTE — Telephone Encounter (Signed)
 noted

## 2023-08-13 NOTE — Addendum Note (Signed)
 Addended by: Armstead Peaks on: 08/13/2023 04:21 PM   Modules accepted: Orders

## 2023-08-13 NOTE — Telephone Encounter (Signed)
 Mindy/Tammy: please arrange GES due to chronic nausea/vomiting.

## 2023-08-13 NOTE — Telephone Encounter (Signed)
 Checked radmd and no PA required

## 2023-08-21 ENCOUNTER — Encounter (HOSPITAL_COMMUNITY)
Admission: RE | Admit: 2023-08-21 | Discharge: 2023-08-21 | Disposition: A | Discharge status: Home / Self Care | Payer: Medicaid Other | Source: Ambulatory Visit | Attending: Gastroenterology | Admitting: Gastroenterology

## 2023-08-21 ENCOUNTER — Encounter (HOSPITAL_COMMUNITY): Payer: Self-pay

## 2023-08-21 DIAGNOSIS — R112 Nausea with vomiting, unspecified: Secondary | ICD-10-CM | POA: Insufficient documentation

## 2023-08-21 MED ORDER — TECHNETIUM TC 99M SULFUR COLLOID
2.0000 | Freq: Once | INTRAVENOUS | Status: AC | PRN
  Administered 2023-08-21: 2.2 via ORAL

## 2023-08-22 ENCOUNTER — Telehealth (INDEPENDENT_AMBULATORY_CARE_PROVIDER_SITE_OTHER): Payer: Medicaid Other | Admitting: Gastroenterology

## 2023-08-22 VITALS — Ht 67.0 in | Wt 193.0 lb

## 2023-08-22 DIAGNOSIS — K2100 Gastro-esophageal reflux disease with esophagitis, without bleeding: Secondary | ICD-10-CM

## 2023-08-22 DIAGNOSIS — R1084 Generalized abdominal pain: Secondary | ICD-10-CM | POA: Diagnosis not present

## 2023-08-22 DIAGNOSIS — K219 Gastro-esophageal reflux disease without esophagitis: Secondary | ICD-10-CM | POA: Insufficient documentation

## 2023-08-22 MED ORDER — PROCHLORPERAZINE MALEATE 5 MG PO TABS: 5.0000 mg | ORAL_TABLET | Freq: Four times a day (QID) | ORAL | 2 refills | Status: AC | PRN

## 2023-08-22 MED ORDER — DEXLANSOPRAZOLE 60 MG PO CPDR: 60.0000 mg | DELAYED_RELEASE_CAPSULE | Freq: Every day | ORAL | 3 refills | Status: AC
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
## 2023-08-22 NOTE — Patient Instructions (Signed)
 Let's try taking dicyclomine only as needed for looser stool. Take Miralax on any given day you feel you are constipation or sluggish.  Let me know in about 2-4 weeks how this is working for you. It may be that you are baseline constipated and need something stronger.  I have sent in Dexilant to take once a day instead of the other reflux medications.  I have also sent in compazine to take every 6-8 hours as needed. Let's stop phenergan as this is not helping. Do not take compazine and phenergan together.  We will see you in 3 months!  I enjoyed seeing you again today! I value our relationship and want to provide genuine, compassionate, and quality care. You may receive a survey regarding your visit with me, and I welcome your feedback! Thanks so much for taking the time to complete this. I look forward to seeing you again.      [name] Mink, PhD, ANP-BC [facility]

## 2023-08-30 ENCOUNTER — Telehealth: Payer: Self-pay

## 2023-08-30 NOTE — Telephone Encounter (Signed)
 PA done on Dexlansoprazole 60mg  DR Cap.  On Cover My Meds. Dx used: K21.00 and R10.84. pt has tried and failed: Omeprazole and Pantoprazole. Waiting on a response.

## 2023-09-02 NOTE — Telephone Encounter (Signed)
 Kaitlyn Ferrell: Documentation from [facility] being unable to process request for Dexlansoprazole but however the members medication has been switched to a recommended formulary alternative, Dexilant (brand)

## 2023-09-02 NOTE — Telephone Encounter (Signed)
 So all is well then, right?

## 2023-09-02 NOTE — Telephone Encounter (Signed)
 I have phoned the pt Kaitlyn Ferrell 2 and each time it states call cannot be completed. So she may have it off

## 2023-10-02 ENCOUNTER — Encounter: Payer: Self-pay | Admitting: Gastroenterology
